# Patient Record
Sex: Male | Born: 1955 | Race: Black or African American | Hispanic: No | Marital: Married | Smoking: Never smoker
Health system: Southern US, Community
[De-identification: ages and names within clinical notes are randomized; demographics above are authoritative.]

## PROBLEM LIST (undated history)

## (undated) ENCOUNTER — Ambulatory Visit (HOSPITAL_COMMUNITY): Disposition: A | Discharge status: Home / Self Care | Payer: Self-pay

## (undated) DIAGNOSIS — H269 Unspecified cataract: Secondary | ICD-10-CM

## (undated) DIAGNOSIS — I10 Essential (primary) hypertension: Secondary | ICD-10-CM

## (undated) HISTORY — DX: Unspecified cataract: H26.9

## (undated) HISTORY — PX: TOE SURGERY: SHX1073

---

## 1999-11-29 ENCOUNTER — Emergency Department (HOSPITAL_COMMUNITY): Admission: EM | Admit: 1999-11-29 | Discharge: 1999-11-29 | Payer: Self-pay | Admitting: *Deleted

## 2000-07-06 ENCOUNTER — Inpatient Hospital Stay (HOSPITAL_COMMUNITY): Admission: EM | Admit: 2000-07-06 | Discharge: 2000-07-08 | Payer: Self-pay | Admitting: Emergency Medicine

## 2000-07-06 ENCOUNTER — Encounter: Payer: Self-pay | Admitting: Emergency Medicine

## 2011-03-08 NOTE — Discharge Summary (Signed)
[place]  Patient:    Adam Martinez, Adam Martinez                      MRN: [id number] Adm. Date:  [id number] Disc. Date: [id number] Attending:  Arsenio Loader Dictator:   Ilene Qua, M.D.                           Discharge Summary  DATE OF BIRTH:  [birth year].  DISCHARGE DIAGNOSES: 1. Syncope and vertigo. 2. Left lower extremity deep venous thrombosis.  CONSULTS:  None.  PROCEDURES: 1. A 2D echocardiogram was performed which was normal and included a bubble    study to rule out any septal defect. 2. A CT of the head was normal. 3. Chest CT for PE protocol revealed no PE. 4. The CT of the lower extremities revealed the left leg with soft tissue    fluid decreased density in the left popliteal vein, not definitive    _____________ for DVT. 5. Lower extremity Dopplers on July 07, 2000, revealed a positive left    short segmental DVT in the mid superficial femoral vein. 6. MRI of the head showed mild paranasal sinus disease but was otherwise    unremarkable.  DISCHARGE MEDICATIONS: 1. Percocet one to two p.o. q.4h. p.r.n. 2. Lovenox 85 mg subcutaneous b.i.d. as directed. 3. Coumadin 5 mg p.o. q.d.  ACTIVITY:  He was instructed to limit his exercise over the next week and to use crutches until he was able to bear weight on his left leg.  SPECIAL INSTRUCTIONS:  He was instructed to return to the emergency room if he should develop a sudden onset of shortness of breath.  He will continue to take his Lovenox injections and the Coumadin until his INR level was greater than 2.0.  At that time the Lovenox injections will be stopped.  FOLLOW-UP: He is scheduled for a blood test on Friday, July 11, 2000, at 9:30 a.m.; then on Friday, July 18, 2000, and the following Friday at the [facility] for PT and INR levels.  He is scheduled for a hospital follow-up appointment on August 07, 2000, at 1:30 p.m. with myself,  Ilene Qua, M.D., at the [facility].  He will need to follow up then on his PT INR as well as HIV test and protein C&S tests which are still pending.  BRIEF HISTORY OF PRESENT ILLNESS:  Adam Martinez is a 55 year old black male with no significant past medical history.  He presented to the emergency room after two episodes of syncope.  One occurred after he stood up and was walking to the bathroom.  He felt cold, sweaty and dizzy and described the room spinning around about one minute before things went black.  He denied any chest pain, headache, but his wife states that he was out for about 10 minutes.  When he woke up, he rolled over onto his back and looked at the ceiling. Again, he reported the feeling of beginning to spin around and he felt weak and dizzy again and again passed out for another five minutes.  He has not had a previous episode of syncope or presyncope and only noted an injury to his left leg last week while playing soccer.  He felt that he pulled a muscle and stated that the pain currently was about an 8/10 and relieved somewhat with Advil.  ADMISSION PHYSICAL EXAMINATION:  GENERAL APPEARANCE:  He was a pleasant black male in no acute distress.  VITAL SIGNS:  Temperature 97.1, blood pressure 122/77, pulse 58, respiratory rate 20.  He was saturating 98% on room air.  LUNGS:  Clear.  CARDIOVASCULAR:  He had regular rate and rhythm without murmur.  EXTREMITIES:  There was no erythema or swelling of either extremity but there was severe tenderness to palpation of the left upper inner thigh.  No ecchymosis or cords could be palpated but his range of motion was limited in that leg as well secondary to pain.  LABS ON ADMISSION:  Normal.  BMP with sodium of 137, potassium 4.3, chloride 109, bicarb 29, BUN 13, creatinine 0.9, glucose 109.  The rest of his CMP was normal. White count was 6.9, hemoglobin 14.1, platelet count 223.  PT was 15.4, INR  1.4, PTT 21.  Blood gas was 7.40, 43 and 82 on room air.  CK was 208, MB 1.0 and troponin I was 0.03.  EKG revealed sinus bradycardia at 55 beats per minute and was otherwise normal.  CT of the head showed no acute disease. CT of the chest showed no PE. The CT of the lower extremities showed a left soft tissue fluid decreased density of the left popliteal vein, worrisome for DVT.  HOSPITAL COURSE:  #1 - SYNCOPE AND VERTIGO:  As these occurred together, this was concerning for some central neurologic process to explain both of these and could not be just attributed to vasogenic syncope given the vertigo.  For this reason, an MRI was performed to evaluate for any cerebellar or brainstem process, which was normal.  Also a 2D echocardiogram was performed to evaluate for possible ASD that could cause embolus from his DVT to travel to his brain which could provide an explanation for both of these symptoms.  A bubble study was performed with the 2D echo which was negative for any paradoxical emboli.  He did not have any further episodes of syncope or vertigo while in the hospital. At the time of discharge there was no explanation for his vertigo but the syncope did appear to be vasogenic.  #2 - LEFT LOWER EXTREMITY DEEP VENOUS THROMBOSIS:  After the CT scan was done per protocol in the ER, lower extremity Dopplers were performed to confirm the diagnosis of a left short segmental DVT in the mid superficial femoral vein. For this reason he was started on Lovenox 80 mg b.i.d. subcutaneous.  He opted to go home on Lovenox as his Coumadin becomes therapeutic.  His PT and INR on the day of discharge were 15.5 and 1.4 for his INR toward a goal of 2 to 3 on the INR.  He was sent home on Coumadin 5 mg to be taken daily and given a close follow-up at the [facility] for further adjustments of his Coumadin dosing.  He was instructed on how to give Lovenox injection and he verbalized  understanding of this as well as explained some of the possible complications of his deep venous thrombosis and the importance of taking his  anticoagulation therapy as prescribed. The tenderness in his left upper extremity did improve somewhat while he was in the hospital but he did require quite an amount of pain medication including morphine which did not help as much as the Percocet.  He was sent home with a prescription for some Percocet temporarily until the pain resolved.  CONDITION ON DISCHARGE:  Stable. DD:  07/23/00 TD:  07/24/00 Job: [id number]  [id number]

## 2011-07-07 ENCOUNTER — Emergency Department (HOSPITAL_COMMUNITY)
Admission: EM | Admit: 2011-07-07 | Discharge: 2011-07-07 | Disposition: A | Discharge status: Home / Self Care | Payer: No Typology Code available for payment source | Attending: Emergency Medicine | Admitting: Emergency Medicine

## 2011-07-07 ENCOUNTER — Emergency Department (HOSPITAL_COMMUNITY): Payer: No Typology Code available for payment source

## 2011-07-07 DIAGNOSIS — S139XXA Sprain of joints and ligaments of unspecified parts of neck, initial encounter: Secondary | ICD-10-CM | POA: Diagnosis not present

## 2011-07-07 DIAGNOSIS — IMO0002 Reserved for concepts with insufficient information to code with codable children: Secondary | ICD-10-CM | POA: Diagnosis not present

## 2011-07-07 DIAGNOSIS — Z043 Encounter for examination and observation following other accident: Secondary | ICD-10-CM | POA: Diagnosis present

## 2011-07-07 DIAGNOSIS — R110 Nausea: Secondary | ICD-10-CM | POA: Diagnosis not present

## 2011-07-07 DIAGNOSIS — I10 Essential (primary) hypertension: Secondary | ICD-10-CM | POA: Diagnosis not present

## 2012-07-09 ENCOUNTER — Ambulatory Visit
Admission: RE | Admit: 2012-07-09 | Discharge: 2012-07-09 | Disposition: A | Discharge status: Home / Self Care | Payer: BC Managed Care – PPO | Source: Ambulatory Visit | Attending: General Practice | Admitting: General Practice

## 2012-07-09 ENCOUNTER — Other Ambulatory Visit: Payer: Self-pay | Admitting: General Practice

## 2012-07-09 DIAGNOSIS — I10 Essential (primary) hypertension: Secondary | ICD-10-CM

## 2016-07-10 ENCOUNTER — Emergency Department (HOSPITAL_COMMUNITY): Payer: Self-pay

## 2016-07-10 ENCOUNTER — Ambulatory Visit (HOSPITAL_COMMUNITY)
Admission: EM | Admit: 2016-07-10 | Discharge: 2016-07-10 | Disposition: A | Discharge status: Nursing Home (Includes ICF) | Payer: Self-pay | Attending: Family Medicine | Admitting: Family Medicine

## 2016-07-10 ENCOUNTER — Encounter (HOSPITAL_COMMUNITY): Payer: Self-pay | Admitting: Emergency Medicine

## 2016-07-10 ENCOUNTER — Emergency Department (HOSPITAL_COMMUNITY)
Admission: EM | Admit: 2016-07-10 | Discharge: 2016-07-10 | Disposition: A | Discharge status: Home / Self Care | Payer: Self-pay | Attending: Emergency Medicine | Admitting: Emergency Medicine

## 2016-07-10 DIAGNOSIS — R4701 Aphasia: Secondary | ICD-10-CM | POA: Insufficient documentation

## 2016-07-10 DIAGNOSIS — R479 Unspecified speech disturbances: Secondary | ICD-10-CM

## 2016-07-10 DIAGNOSIS — I10 Essential (primary) hypertension: Secondary | ICD-10-CM | POA: Insufficient documentation

## 2016-07-10 DIAGNOSIS — R9431 Abnormal electrocardiogram [ECG] [EKG]: Secondary | ICD-10-CM | POA: Insufficient documentation

## 2016-07-10 DIAGNOSIS — R002 Palpitations: Secondary | ICD-10-CM

## 2016-07-10 DIAGNOSIS — R791 Abnormal coagulation profile: Secondary | ICD-10-CM | POA: Insufficient documentation

## 2016-07-10 DIAGNOSIS — I169 Hypertensive crisis, unspecified: Secondary | ICD-10-CM

## 2016-07-10 DIAGNOSIS — R4789 Other speech disturbances: Secondary | ICD-10-CM

## 2016-07-10 HISTORY — DX: Essential (primary) hypertension: I10

## 2016-07-10 LAB — I-STAT CHEM 8, ED
BUN: 15 mg/dL (ref 6–20)
CALCIUM ION: 1.18 mmol/L (ref 1.15–1.40)
CREATININE: 1.2 mg/dL (ref 0.61–1.24)
Chloride: 102 mmol/L (ref 101–111)
GLUCOSE: 93 mg/dL (ref 65–99)
HCT: 52 % (ref 39.0–52.0)
Hemoglobin: 17.7 g/dL — ABNORMAL HIGH (ref 13.0–17.0)
Potassium: 4.1 mmol/L (ref 3.5–5.1)
Sodium: 142 mmol/L (ref 135–145)
TCO2: 29 mmol/L (ref 0–100)

## 2016-07-10 LAB — DIFFERENTIAL
BASOS ABS: 0.1 10*3/uL (ref 0.0–0.1)
Basophils Relative: 1 %
EOS ABS: 0.1 10*3/uL (ref 0.0–0.7)
Eosinophils Relative: 2 %
LYMPHS ABS: 2.3 10*3/uL (ref 0.7–4.0)
Lymphocytes Relative: 43 %
MONOS PCT: 6 %
Monocytes Absolute: 0.3 10*3/uL (ref 0.1–1.0)
Neutro Abs: 2.5 10*3/uL (ref 1.7–7.7)
Neutrophils Relative %: 48 %

## 2016-07-10 LAB — APTT: APTT: 31 s (ref 24–36)

## 2016-07-10 LAB — PROTIME-INR
INR: 1.21
Prothrombin Time: 15.4 seconds — ABNORMAL HIGH (ref 11.4–15.2)

## 2016-07-10 LAB — COMPREHENSIVE METABOLIC PANEL
ALT: 21 U/L (ref 17–63)
AST: 28 U/L (ref 15–41)
Albumin: 4.7 g/dL (ref 3.5–5.0)
Alkaline Phosphatase: 74 U/L (ref 38–126)
Anion gap: 8 (ref 5–15)
BILIRUBIN TOTAL: 0.9 mg/dL (ref 0.3–1.2)
BUN: 12 mg/dL (ref 6–20)
CO2: 28 mmol/L (ref 22–32)
CREATININE: 1.18 mg/dL (ref 0.61–1.24)
Calcium: 10 mg/dL (ref 8.9–10.3)
Chloride: 105 mmol/L (ref 101–111)
GFR calc Af Amer: >60 mL/min (ref >60)
Glucose, Bld: 94 mg/dL (ref 65–99)
POTASSIUM: 4.2 mmol/L (ref 3.5–5.1)
Sodium: 141 mmol/L (ref 135–145)
TOTAL PROTEIN: 7.8 g/dL (ref 6.5–8.1)

## 2016-07-10 LAB — CBC
HEMATOCRIT: 50 % (ref 39.0–52.0)
HEMOGLOBIN: 16.9 g/dL (ref 13.0–17.0)
MCH: 28.3 pg (ref 26.0–34.0)
MCHC: 33.8 g/dL (ref 30.0–36.0)
MCV: 83.6 fL (ref 78.0–100.0)
Platelets: 216 10*3/uL (ref 150–400)
RBC: 5.98 MIL/uL — ABNORMAL HIGH (ref 4.22–5.81)
RDW: 13.1 % (ref 11.5–15.5)
WBC: 5.3 10*3/uL (ref 4.0–10.5)

## 2016-07-10 LAB — I-STAT TROPONIN, ED: TROPONIN I, POC: 0.02 ng/mL (ref 0.00–0.08)

## 2016-07-10 NOTE — ED Notes (Signed)
Pt will be transferred to ED via shuttle for further evaluation.  Report was called to Janett Billow, RN in the ED.

## 2016-07-10 NOTE — ED Triage Notes (Signed)
Pt arrives via POV sent from urgent care for further eval of stroke like symptoms. Pt reports 3 weeks of aphasia, difficulty with word finding, slurred speech, right sided headache and seeing shadows in right eye. Pt states symptoms started after tooth extraction several weeks ago. Pt also reports heart palpitations while lying flat at night EKG done in triage shows changes from previous. Denies pain at present. Awake, alert, oriented x4, no droop or drift noted.

## 2016-07-10 NOTE — ED Triage Notes (Addendum)
Pt reports having several symptoms over the last two weeks since having a tooth extracted.  It started with the pain in his tooth, reporting a throbbing on the right side of his head, above the area of the tooth pain.  After the extraction of the tooth he reports a twitching feeling in the same spot he had the throbbing about 3 times a day, flashes of light in his peripheral, and a sensitivity to light.  He also reports some symptoms of aphasia where he will say a word and realize later it was the wrong word.  Pt admits to history of HBP but does not take medication for it.

## 2016-07-10 NOTE — Discharge Instructions (Signed)
°  All the results in the ER are normal, labs and imaging. We are not sure what is causing your symptoms. The workup in the ER is not complete, and is limited to screening for life threatening and emergent conditions only, so please see a primary care doctor for further evaluation.  Now incidentally we found abnormal ekg - please see the Cardiologist for optima evaluation immediately.  Please return to the ER if you have worsening chest pain, shortness of breath, pain radiating to your jaw, shoulder, or back, sweats or fainting. Otherwise see the Cardiologist or your primary care doctor as requested.

## 2016-07-10 NOTE — ED Notes (Signed)
Patient transported to MRI 

## 2016-07-10 NOTE — ED Provider Notes (Signed)
CSN: [id number]     Arrival date & time 07/10/16  1100 History   First MD Initiated Contact with Patient 07/10/16 1310     Chief Complaint  Patient presents with  . Aphasia  . Photophobia   (Consider location/radiation/quality/duration/timing/severity/associated sxs/prior Treatment) Adam Martinez is a well-appearing 60 y.o, with history of uncontrolled HTN, presents today for photosensitivity and difficulty speaking. His BP is 192/111 in room. He self d/c his BP medication 1 year ago and have since pursue yoga, exercise and other lifestyle changes to manage his blood pressure. He is here complaining of "missing my speech". He reports that when he see something, like a phone, he would call it or say wallet but meant to say phone instead and would immediately correct himself. This speech difficulty have been ongoing daily on and off for the last 3 weeks. He also states "there is something shaking in here ", point to his tempo area of the head, and describes it as a "throbbing" but no pain. He throbbing have been present for 3 weeks as well. He initially thought it was due to his toothache, but he got his wisdom tooth removed 2 weeks ago and the symptoms are still present. He also endorses fatigue, photosensitivity, heart palpitation, and overall feels "blur". He denies headache, nausea, dizziness, ear pain, tinnitus, chest pain, or shortness of breath.       Past Medical History:  Diagnosis Date  . Hypertension    History reviewed. No pertinent surgical history. Family History  Problem Relation Age of Onset  . Heart failure Mother   . Asthma Father    Social History  Substance Use Topics  . Smoking status: Never Smoker  . Smokeless tobacco: Never Used  . Alcohol use Yes     Comment: occasional    Review of Systems  Constitutional: Positive for fatigue. Negative for activity change and fever.  HENT: Negative for congestion, ear pain and tinnitus.   Eyes: Positive for photophobia.  Negative for pain, discharge, redness and visual disturbance.  Respiratory: Negative for cough and shortness of breath.   Cardiovascular: Positive for palpitations. Negative for chest pain and leg swelling.  Gastrointestinal: Negative for abdominal pain, diarrhea, nausea and vomiting.  Musculoskeletal: Negative for arthralgias and myalgias.  Skin: Negative for rash.  Neurological: Negative for dizziness, weakness and headaches.    Allergies  Chloroquine  Home Medications   Prior to Admission medications   Not on File   Meds Ordered and Administered this Visit  Medications - No data to display  BP (!) 192/111 (BP Location: Left Arm)   Pulse 70   Temp 98.1 F (36.7 C) (Oral)   SpO2 100%  No data found.   Physical Exam  Constitutional: He is oriented to person, place, and time. He appears well-developed and well-nourished. No distress.  HENT:  Head: Normocephalic and atraumatic.  Right Ear: External ear normal.  Left Ear: External ear normal.  Nose: Nose normal.  Mouth/Throat: Oropharynx is clear and moist. No oropharyngeal exudate.  Eyes: EOM are normal. Pupils are equal, round, and reactive to light.  Neck: Normal range of motion. Neck supple.  Cardiovascular: Normal rate, regular rhythm and normal heart sounds.   No murmur heard. Pulmonary/Chest: Effort normal and breath sounds normal. No respiratory distress. He has no wheezes.  Abdominal: Soft. Bowel sounds are normal. He exhibits no distension. There is no tenderness.  Lymphadenopathy:    He has no cervical adenopathy.  Neurological: He is alert and oriented to  person, place, and time. No cranial nerve deficit. Coordination normal.  Negative romberg, had intact coordination, muscle strength are equally strong in the upper and lower extremities. Had no arm or leg drift. Had no facial droop, speech is normal in room. Cranial nerves intact.   Skin: Skin is warm and dry. He is not diaphoretic.  Nursing note and vitals  reviewed.   Urgent Care Course   Clinical Course    Procedures (including critical care time)  Labs Review Labs Reviewed - No data to display  Imaging Review     MDM   1. Speech abnormality   2. Heart palpitations   3. Hypertensive crisis    Although the physical examination was unremarkable, the reported complaints are concerning and requires further workup. Patient transferred to ER via shuttle for further evaluation and for diagnostic imaging.     Barry Dienes, NP 07/10/16 1609

## 2016-07-11 ENCOUNTER — Telehealth (HOSPITAL_BASED_OUTPATIENT_CLINIC_OR_DEPARTMENT_OTHER): Payer: Self-pay | Admitting: Emergency Medicine

## 2016-07-11 ENCOUNTER — Encounter: Payer: Self-pay | Admitting: Interventional Cardiology

## 2016-07-11 DIAGNOSIS — R9431 Abnormal electrocardiogram [ECG] [EKG]: Secondary | ICD-10-CM | POA: Insufficient documentation

## 2016-07-11 DIAGNOSIS — I119 Hypertensive heart disease without heart failure: Secondary | ICD-10-CM | POA: Insufficient documentation

## 2016-07-11 NOTE — ED Provider Notes (Signed)
[place] DEPT Provider Note   CSN: [id number] Arrival date & time: 07/10/16  1403     History   Chief Complaint Chief Complaint  Patient presents with  . Aphasia  . Headache  . Palpitations    HPI Adam Martinez is a 60 y.o. male.  HPI Pt comes in with cc of aphasia, sent from UC. Pt reports that for the last several days he has been having difficulty with word finding when he is having conversation. 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 Pt denies any numbness, tingling, focal weakness, slurred speech, vision complains, dizziness. He is also noted to have elevated BP, and there is no associated chest pain, dib. PT is active and walks/runs 2x daily without any symptoms.   Past Medical History:  Diagnosis Date  . Hypertension     There are no active problems to display for this patient.   History reviewed. No pertinent surgical history.     Home Medications    Prior to Admission medications   Not on File    Family History Family History  Problem Relation Age of Onset  . Heart failure Mother   . Asthma Father     Social History Social History  Substance Use Topics  . Smoking status: Never Smoker  . Smokeless tobacco: Never Used  . Alcohol use Yes     Comment: occasional     Allergies   Chloroquine   Review of Systems Review of Systems  ROS 10 Systems reviewed and are negative for acute change except as noted in the HPI.     Physical Exam Updated Vital Signs BP 187/91   Pulse 66   Temp 98.3 F (36.8 C)   Resp 16   SpO2 99%   Physical Exam  Constitutional: He is oriented to person, place, and time. He appears well-developed.  HENT:  Head: Normocephalic and atraumatic.  Eyes: Conjunctivae and EOM are normal. Pupils are equal, round, and reactive to light.  Neck: Normal range of motion. Neck supple.  Cardiovascular: Normal rate and  regular rhythm.   Murmur heard. Pulmonary/Chest: Effort normal and breath sounds normal.  Abdominal: Soft. Bowel sounds are normal. He exhibits no distension. There is no tenderness. There is no rebound and no guarding.  Neurological: He is alert and oriented to person, place, and time. No cranial nerve deficit. Coordination normal.  Cerebellar exam is normal (finger to nose) Sensory exam normal for bilateral upper and lower extremities - and patient is able to discriminate between sharp and dull. Motor exam is 4+/5   Skin: Skin is warm.  Nursing note and vitals reviewed.    ED Treatments / Results  Labs (all labs ordered are listed, but only abnormal results are displayed) Labs Reviewed  PROTIME-INR - Abnormal; Notable for the following:       Result Value   Prothrombin Time 15.4 (*)    All other components within normal limits  CBC - Abnormal; Notable for the following:    RBC 5.98 (*)    All other components within normal limits  I-STAT CHEM 8, ED - Abnormal; Notable for the following:    Hemoglobin 17.7 (*)    All other components within normal limits  APTT  DIFFERENTIAL  COMPREHENSIVE METABOLIC PANEL  Randolm Idol, ED    EKG  EKG Interpretation  Date/Time:  Wednesday July 10 2016 14:05:51  EDT Ventricular Rate:  62 PR Interval:  178 QRS Duration: 88 QT Interval:  436 QTC Calculation: 442 R Axis:   56 Text Interpretation:  Normal sinus rhythm with sinus arrhythmia Possible Left atrial enlargement ST & T wave abnormality, consider inferolateral ischemia Abnormal ECG Nonspecific ST abnormality diffuse ST depression - new Diffuse TWI - new New findings since ther last ekg in 2001 Confirmed by Kathrynn Humble, MD, Thelma Comp [phone]) on 07/10/2016 6:31:55 PM       Radiology Ct Head Wo Contrast  Result Date: 07/10/2016 CLINICAL DATA:  60 year old male Status post tooth excision a month ago with episode of right side headache and aphasia. Initial encounter. EXAM:  CT HEAD WITHOUT CONTRAST TECHNIQUE: Contiguous axial images were obtained from the base of the skull through the vertex without intravenous contrast. COMPARISON:  Head CT and cervical spine CT 06/27/2011. FINDINGS: Brain: Cerebral volume remains normal. No midline shift, ventriculomegaly, mass effect, evidence of mass lesion, intracranial hemorrhage or evidence of cortically based acute infarction. Gray-white matter differentiation is within normal limits throughout the brain. Vascular: No suspicious intracranial vascular hyperdensity. Calcified atherosclerosis at the skull base. Skull: Dentition not included. No acute osseous abnormality identified. Sinuses/Orbits: Mild ethmoid and anterior sphenoid sinus mucosal thickening appears stable since 2012. Right maxillary sinus mucosal thickening has regressed. Mastoids and tympanic cavities are clear. Other: No acute orbit or scalp soft tissue finding. IMPRESSION: 1. Stable and normal noncontrast CT appearance of the brain. 2. Chronic ethmoid and sphenoid paranasal sinus mucosal thickening appears stable. Visible maxillary sinus mucosal thickening appears regressed. Electronically Signed   By: Genevie Ann M.D.   On: 07/10/2016 15:59   Mr Brain Wo Contrast  Result Date: 07/10/2016 CLINICAL DATA:  Stroke-like symptoms. Aphasia and right-sided headache. Right eye visual abnormalities. EXAM: MRI HEAD WITHOUT CONTRAST TECHNIQUE: Multiplanar, multiecho pulse sequences of the brain and surrounding structures were obtained without intravenous contrast. COMPARISON:  Head CT same day FINDINGS: Brain: No acute infarct or intraparenchymal hemorrhage. The midline structures are normal. There are a few small foci of hyperintense T2 weighted signal within the periventricular and subcortical white matter in a nonspecific distribution but most commonly seen in the setting of chronic microvascular ischemia. No mass lesion or midline shift. No hydrocephalus or extra-axial fluid  collection. Vascular: Major intracranial flow voids are preserved. No evidence of chronic microhemorrhage or amyloid angiopathy. Skull and upper cervical spine: The visualized skull base, calvarium, upper cervical spine and extracranial soft tissues are normal. Sinuses/Orbits: There is mild mucosal thickening of the ethmoid air cells, right frontal sinus and right maxillary sinus. There is a retention cyst along the left maxillary floor. No mastoid effusion. Normal orbits. IMPRESSION: 1. No acute intracranial abnormality. 2. Mild multifocal white matter T2 hyperintensity, nonspecific but most suggestive of chronic microvascular ischemia. Electronically Signed   By: Ulyses Jarred M.D.   On: 07/10/2016 19:04    Procedures Procedures (including critical care time)  Medications Ordered in ED Medications - No data to display   Initial Impression / Assessment and Plan / ED Course  I have reviewed the triage vital signs and the nursing notes.  Pertinent labs & imaging results that were available during my care of the patient were reviewed by me and considered in my medical decision making (see chart for details).  Clinical Course    Pt comes in with cc of word finding difficulty. Sent here from [facility] for MRI. On my exam - pt has no aphasia. He does indicate that  more and more he is having difficulty thinking of a specific word when he is speaking. Seems more of a memory issues - possibly dementia?  PT however is noted with elevated BP. He has some headaches - but they are mild. Denies chest pain, dib, vision complains. We will get MRI of the brain to ensure there is no acute infarcts - if neg, will defer workup to pcp. EKG done as part of the stroke panel by nursing does show LVH and diffuse TWI. It will be important for him to f/u with Cards, and that plan has been discussed.  Strict ER return precautions have been discussed, and patient is agreeing with the plan and is comfortable with the workup  done and the recommendations from the ER.  Final Clinical Impressions(s) / ED Diagnoses   Final diagnoses:  Abnormal EKG  Word finding difficulty    New Prescriptions There are no discharge medications for this patient.    Varney Biles, MD 07/18/16 [phone]

## 2016-07-12 ENCOUNTER — Encounter: Payer: Self-pay | Admitting: Interventional Cardiology

## 2016-07-12 ENCOUNTER — Ambulatory Visit (INDEPENDENT_AMBULATORY_CARE_PROVIDER_SITE_OTHER): Payer: Self-pay | Admitting: Interventional Cardiology

## 2016-07-12 VITALS — BP 180/92 | HR 80 | Ht 69.0 in | Wt 193.0 lb

## 2016-07-12 DIAGNOSIS — R9431 Abnormal electrocardiogram [ECG] [EKG]: Secondary | ICD-10-CM

## 2016-07-12 DIAGNOSIS — I119 Hypertensive heart disease without heart failure: Secondary | ICD-10-CM

## 2016-07-12 DIAGNOSIS — I517 Cardiomegaly: Secondary | ICD-10-CM

## 2016-07-12 MED ORDER — AMLODIPINE BESYLATE 2.5 MG PO TABS: 2.5000 mg | ORAL_TABLET | Freq: Every day | ORAL | 3 refills | Status: DC

## 2016-07-12 NOTE — Patient Instructions (Signed)
Medication Instructions:  Your physician has recommended you make the following change in your medication:  START Amlodipine 2.5mg  daily an Rx has been sent to your pharmacy    Labwork: None ordered  Testing/Procedures: Your physician has requested that you have an echocardiogram. Echocardiography is a painless test that uses sound waves to create images of your heart. It provides your doctor with information about the size and shape of your heart and how well your heart's chambers and valves are working. This procedure takes approximately one hour. There are no restrictions for this procedure.  Your physician has requested that you have an exercise tolerance test. For further information please visit [email or web address]. Please also follow instruction sheet, as given. ( To be scheduled after 7 days)  Follow-Up: Your physician recommends that you schedule a follow-up appointment after testing with Dr.Smith   Any Other Special Instructions Will Be Listed Below (If Applicable).     If you need a refill on your cardiac medications before your next appointment, please call your pharmacy.

## 2016-07-12 NOTE — Progress Notes (Signed)
Cardiology Office Note    Date:  07/12/2016   ID:  Adam Martinez, DOB [birth year], MRN [id number]  PCP:  No PCP Per Patient  Cardiologist: Sinclair Grooms, MD   Chief Complaint  Patient presents with  . Advice Only    Abnormal EKG and elevated blood pressure    History of Present Illness:  Adam Martinez is a 60 y.o. male who is referred from the emergency department for follow-up of an abnormal electrocardiogram obtained during an evaluation of headache. During that hospital visit he was noted to have significant blood pressure elevation. The EKG revealed evidence of left ventricular hypertrophy with strain.  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 He has a history of elevated blood pressure and states that each attempt to treat it has caused him to get sick and have near fainting episodes. He has had no recent therapy. Blood pressures in the emergency room were greater than 170/100 mmHg. Previous medications tried were Benicar, HCTZ, and some other medication that he can't remember.  He is asymptomatic today. He plays soccer on a regular basis. There is some dyspnea with running until he gets warmed up. After running for a while his breathing settles out. He denies chest pain.  [place] is [place] His mother died of a heart film and that was poorly characterized. He feels that she had some difficulty with heart rhythm. He is uncertain if hypertension is a family problem.  He has known of high blood pressure for greater than 10 years. He has never been on consistent therapy.  Past Medical History:  Diagnosis Date  . Hypertension     No past surgical history on file.  Current Medications: No outpatient prescriptions prior to visit.   No facility-administered medications prior to visit.      Allergies:   Chloroquine   Social History   Social History  .  Marital status: Married    Spouse name: N/A  . Number of children: N/A  . Years of education: N/A   Social History Main Topics  . Smoking status: Never Smoker  . Smokeless tobacco: Never Used  . Alcohol use Yes     Comment: occasional  . Drug use: No  . Sexual activity: Not Asked   Other Topics Concern  . None   Social History Narrative  . None     Family History:  The patient's family history includes Asthma in his father; Heart failure in his mother.   ROS:   Please see the history of present illness.    Occasional vision disturbance, occasional palpitations, chronic low back pain.  All other systems reviewed and are negative.   PHYSICAL EXAM:   VS:  BP (!) 180/92   Pulse 80   Ht 5\' 9"  (1.753 m)   Wt 193 lb (87.5 kg)   BMI 28.50 kg/m    GEN: Well nourished, well developed, in no acute distress  HEENT: normal  Neck: no JVD, carotid bruits, or masses Cardiac: RRR; no murmurs, rubs, or edema . He has an audible apical S4 gallop. Respiratory:  clear to auscultation bilaterally, normal work of breathing GI: soft, nontender, nondistended, + BS MS: no deformity or atrophy  Skin: warm and dry, no rash Neuro:  Alert and Oriented x 3, Strength and sensation are intact Psych: euthymic mood, full affect  Wt Readings from  Last 3 Encounters:  07/12/16 193 lb (87.5 kg)      Studies/Labs Reviewed:   EKG:  EKG  Sinus rhythm, biatrial abnormality, left ventricular hypertrophy with strain.  Recent Labs: 07/10/2016: ALT 21; BUN 15; Creatinine, Ser 1.20; Hemoglobin 17.7; Platelets 216; Potassium 4.1; Sodium 142   Lipid Panel No results found for: CHOL, TRIG, HDL, CHOLHDL, VLDL, LDLCALC, LDLDIRECT  Additional studies/ records that were reviewed today include:  Stage II chronic kidney disease noted on labs as listed above.    ASSESSMENT:    1. Left ventricular hypertrophy   2. Hypertensive heart disease without heart failure   3. Abnormal ECG      PLAN:  In order  of problems listed above:  1. This is based upon the EKG. Differential includes hereditary hypertrophic cardiomyopathy (given the history of his mother's heart problem) versus hypertension induced LVH. An echocardiogram will be done to confirm. 2. Repeat blood pressure in both arms reveals 152/100 mmHg. Difficulty with therapy in the past his been dizziness and near-syncope. 2-D Doppler echocardiogram will be helpful. Amlodipine 2.5 mg daily is started. An exercise treadmill test will be done on this therapy to evaluate blood pressure control 3. LVH with strain as noted above.    Medication Adjustments/Labs and Tests Ordered: Current medicines are reviewed at length with the patient today.  Concerns regarding medicines are outlined above.  Medication changes, Labs and Tests ordered today are listed in the Patient Instructions below. Patient Instructions  Medication Instructions:  Your physician has recommended you make the following change in your medication:  START Amlodipine 2.5mg  daily an Rx has been sent to your pharmacy    Labwork: None ordered  Testing/Procedures: Your physician has requested that you have an echocardiogram. Echocardiography is a painless test that uses sound waves to create images of your heart. It provides your doctor with information about the size and shape of your heart and how well your heart's chambers and valves are working. This procedure takes approximately one hour. There are no restrictions for this procedure.  Your physician has requested that you have an exercise tolerance test. For further information please visit [email or web address]. Please also follow instruction sheet, as given. ( To be scheduled after 7 days)  Follow-Up: Your physician recommends that you schedule a follow-up appointment after testing with Dr.Smith   Any Other Special Instructions Will Be Listed Below (If Applicable).     If you need a refill on your cardiac medications  before your next appointment, please call your pharmacy.      Signed, Sinclair Grooms, MD  07/12/2016 9:49 AM    [facility] [place], [place], [place]  [address] Phone: [phone]; Fax: [phone]

## 2016-07-19 ENCOUNTER — Ambulatory Visit (INDEPENDENT_AMBULATORY_CARE_PROVIDER_SITE_OTHER): Payer: Self-pay

## 2016-07-19 DIAGNOSIS — R9431 Abnormal electrocardiogram [ECG] [EKG]: Secondary | ICD-10-CM

## 2016-07-19 DIAGNOSIS — I119 Hypertensive heart disease without heart failure: Secondary | ICD-10-CM

## 2016-07-19 LAB — EXERCISE TOLERANCE TEST
CHL CUP RESTING HR STRESS: 83 {beats}/min
CHL RATE OF PERCEIVED EXERTION: 12
CSEPED: 5 min
CSEPEDS: 0 s
CSEPEW: 7 METS
CSEPPHR: 131 {beats}/min
MPHR: 162 {beats}/min
Percent HR: 80 %

## 2016-07-22 ENCOUNTER — Telehealth: Payer: Self-pay | Admitting: *Deleted

## 2016-07-22 MED ORDER — AMLODIPINE BESYLATE 5 MG PO TABS: 5.0000 mg | ORAL_TABLET | Freq: Every day | ORAL | 3 refills | Status: DC

## 2016-07-22 NOTE — Telephone Encounter (Signed)
Spoke with pt and informed him of ETT results and Dr. Thompson Caul recommendations.  Pt verbalized understanding and was in agreement with this plan.

## 2016-07-22 NOTE — Telephone Encounter (Signed)
-----   Message from Belva Crome, MD sent at 07/19/2016  5:33 PM EDT ----- Let the patient know stress test confirms that hypertension is a major problem. The current therapy will need to be further up titrated to get good control. Increase amlodipine to 5 mg daily. Further adjustments will be made on return appointment. A copy will be sent to No PCP Per Patient

## 2016-07-23 ENCOUNTER — Other Ambulatory Visit: Payer: Self-pay

## 2016-07-23 ENCOUNTER — Ambulatory Visit (HOSPITAL_COMMUNITY): Payer: BC Managed Care – PPO | Attending: Cardiovascular Disease

## 2016-07-23 DIAGNOSIS — I501 Left ventricular failure: Secondary | ICD-10-CM | POA: Insufficient documentation

## 2016-07-23 DIAGNOSIS — I119 Hypertensive heart disease without heart failure: Secondary | ICD-10-CM | POA: Diagnosis not present

## 2016-07-23 DIAGNOSIS — I517 Cardiomegaly: Secondary | ICD-10-CM | POA: Insufficient documentation

## 2016-08-06 ENCOUNTER — Ambulatory Visit: Payer: BC Managed Care – PPO | Admitting: Interventional Cardiology

## 2016-08-07 ENCOUNTER — Encounter: Payer: Self-pay | Admitting: Interventional Cardiology

## 2020-05-02 ENCOUNTER — Encounter (HOSPITAL_COMMUNITY): Payer: Self-pay

## 2020-05-02 ENCOUNTER — Ambulatory Visit (HOSPITAL_COMMUNITY)
Admission: EM | Admit: 2020-05-02 | Discharge: 2020-05-02 | Disposition: A | Discharge status: Home / Self Care | Payer: BC Managed Care – PPO | Attending: Internal Medicine | Admitting: Internal Medicine

## 2020-05-02 ENCOUNTER — Other Ambulatory Visit: Payer: Self-pay

## 2020-05-02 DIAGNOSIS — S46911A Strain of unspecified muscle, fascia and tendon at shoulder and upper arm level, right arm, initial encounter: Secondary | ICD-10-CM

## 2020-05-02 DIAGNOSIS — I10 Essential (primary) hypertension: Secondary | ICD-10-CM | POA: Diagnosis not present

## 2020-05-02 MED ORDER — MELOXICAM 15 MG PO TABS: 15.0000 mg | ORAL_TABLET | Freq: Every day | ORAL | 1 refills | Status: AC | PRN

## 2020-05-02 MED ORDER — KETOROLAC TROMETHAMINE 60 MG/2ML IM SOLN
60.0000 mg | Freq: Once | INTRAMUSCULAR | Status: AC
  Administered 2020-05-02: 60 mg via INTRAMUSCULAR

## 2020-05-02 MED ORDER — AMLODIPINE BESYLATE 5 MG PO TABS: 5.0000 mg | ORAL_TABLET | Freq: Every day | ORAL | 1 refills | Status: DC

## 2020-05-02 MED ORDER — KETOROLAC TROMETHAMINE 60 MG/2ML IM SOLN
INTRAMUSCULAR | Status: AC
  Filled 2020-05-02: qty 2

## 2020-05-02 NOTE — ED Triage Notes (Signed)
Patient presents here today with complaints of right shoulder pain that began about five months but has gotten worse in the last 1-2 weeks. Patient states he injured his right shoulder playing in a soccer game  - got tripped - and fell on his right side. Patient states he has not gone anywhere to have his right shoulder looked at before now.

## 2020-05-02 NOTE — ED Provider Notes (Signed)
[place]    CSN: [id number] Arrival date & time: 05/02/20  1108      History   Chief Complaint Chief Complaint  Patient presents with  . Shoulder Pain    HPI Adam Martinez is a 64 y.o. male with past medical history of hypertension presents to urgent care today with right shoulder pain.  Patient states symptoms ongoing for several months after injury playing soccer however feels like pain has become worse in the past couple of weeks.  Patient states initial injury occurred when being hit on soccer field and falling onto affected shoulder.  Patient states pain will occasionally keep him up at night and shoulder sometimes feels "locked".  In regards to history of hypertension patient states he has been on blood pressure medication in the past however stopped as he had "hoped he did not need it anymore".  He denies any recent headache, dizziness, chest pain, shortness of breath.    Past Medical History:  Diagnosis Date  . Hypertension     Patient Active Problem List   Diagnosis Date Noted  . Hypertensive heart disease 07/11/2016  . Abnormal ECG 07/11/2016    History reviewed. No pertinent surgical history.     Home Medications    Prior to Admission medications   Medication Sig Start Date End Date Taking? Authorizing Provider  amLODipine (NORVASC) 5 MG tablet Take 1 tablet (5 mg total) by mouth daily. 05/02/20 07/31/20  Rudolpho Sevin, NP  meloxicam (MOBIC) 15 MG tablet Take 1 tablet (15 mg total) by mouth daily as needed for pain. 05/02/20 06/01/20  Rudolpho Sevin, NP    Family History Family History  Problem Relation Age of Onset  . Heart failure Mother   . Asthma Father     Social History Social History   Tobacco Use  . Smoking status: Current Every Day Smoker    Types: Cigarettes  . Smokeless tobacco: Never Used  Vaping Use  . Vaping Use: Never used  Substance Use Topics  . Alcohol use: Yes    Comment: occasional  . Drug use: No      Allergies   Chloroquine   Review of Systems As stated in HPI otherwise negative   Physical Exam Triage Vital Signs ED Triage Vitals  Enc Vitals Group     BP 05/02/20 1325 (!) 195/101     Pulse Rate 05/02/20 1325 79     Resp 05/02/20 1325 16     Temp 05/02/20 1325 98 F (36.7 C)     Temp Source 05/02/20 1325 Oral     SpO2 05/02/20 1325 100 %     Weight --      Height --      Head Circumference --      Peak Flow --      Pain Score 05/02/20 1330 8     Pain Loc --      Pain Edu? --      Excl. in [place]? --    No data found.  Updated Vital Signs BP (!) 189/94 (BP Location: Right Arm)   Pulse 70   Temp 98 F (36.7 C) (Oral)   Resp 16   SpO2 100%      Physical Exam Constitutional:      Appearance: Normal appearance. He is normal weight.  Musculoskeletal:        General: No swelling or deformity.     Cervical back: Normal range of motion and neck supple.  Comments: Pain upon palpation of right posterior shoulder joint space.  No pain with 90 degree flexion of arm.  Pain when asking to touch raised hand to back  Skin:    General: Skin is warm and dry.  Neurological:     General: No focal deficit present.     Mental Status: He is alert and oriented to person, place, and time.  Psychiatric:        Mood and Affect: Mood normal.        Behavior: Behavior normal.     UC Treatments / Results  Labs (all labs ordered are listed, but only abnormal results are displayed) Labs Reviewed - No data to display  EKG   Radiology No results found.  Procedures Procedures (including critical care time)  Medications Ordered in UC Medications  ketorolac (TORADOL) injection 60 mg (60 mg Intramuscular Given 05/02/20 1354)    Initial Impression / Assessment and Plan / UC Course  I have reviewed the triage vital signs and the nursing notes.  Pertinent labs & imaging results that were available during my care of the patient were reviewed by me and considered in my  medical decision making (see chart for details).  Shoulder pain, right -unclear etiology. ? Rotator cuff injury for labral tear  -Toradol IM in office, can take short NSAID course  -refer to [facility] to determine need for further imaging  Hypertension, uncontrolled -pt took himself off Amlodipine hoping he "didn't need it anymore" -long discussion regarding long term effects of uncontrolled htn -restart amlodipine -pt to est primary care  Final Clinical Impressions(s) / UC Diagnoses   Final diagnoses:  Strain of right shoulder, initial encounter  Hypertension, unspecified type     Discharge Instructions     Take the antiinflammatory medication as needed starting tomorrow. Apply ice to your shoulder to see if this helps with pain. I have referred you to [facility] to further evaluate your shoulder pain. You also need to restart your blood pressure medication. Please establish primary care.     ED Prescriptions    Medication Sig Dispense Auth. Provider   amLODipine (NORVASC) 5 MG tablet Take 1 tablet (5 mg total) by mouth daily. 90 tablet Rudolpho Sevin, NP   meloxicam (MOBIC) 15 MG tablet Take 1 tablet (15 mg total) by mouth daily as needed for pain. 30 tablet Rudolpho Sevin, NP     PDMP not reviewed this encounter.   Rudolpho Sevin, NP 05/02/20 2114

## 2020-05-02 NOTE — Discharge Instructions (Addendum)
Take the antiinflammatory medication as needed starting tomorrow. Apply ice to your shoulder to see if this helps with pain. I have referred you to [facility] to further evaluate your shoulder pain. You also need to restart your blood pressure medication. Please establish primary care.

## 2020-05-10 ENCOUNTER — Ambulatory Visit: Payer: Self-pay

## 2020-05-10 ENCOUNTER — Ambulatory Visit: Payer: BC Managed Care – PPO | Admitting: Family Medicine

## 2020-05-10 ENCOUNTER — Encounter: Payer: Self-pay | Admitting: Family Medicine

## 2020-05-10 ENCOUNTER — Other Ambulatory Visit: Payer: Self-pay

## 2020-05-10 VITALS — BP 156/90 | Ht 69.0 in | Wt 190.0 lb

## 2020-05-10 DIAGNOSIS — M7541 Impingement syndrome of right shoulder: Secondary | ICD-10-CM | POA: Diagnosis not present

## 2020-05-10 DIAGNOSIS — M25511 Pain in right shoulder: Secondary | ICD-10-CM

## 2020-05-10 MED ORDER — METHYLPREDNISOLONE ACETATE 40 MG/ML IJ SUSP
40.0000 mg | Freq: Once | INTRAMUSCULAR | Status: AC
  Administered 2020-05-10: 40 mg via INTRA_ARTICULAR

## 2020-05-10 NOTE — Patient Instructions (Signed)

## 2020-05-10 NOTE — Assessment & Plan Note (Signed)
Patient symptoms exam and ultrasound findings are consistent with right shoulder impingement syndrome.  He had noticeable impingement of the subscap tendon on the dynamic view under ultrasound guidance with subacromial bursitis.  Given patient has already tried prescription strength NSAID and various conservative measures we proceeded with a corticosteroid shot of the subacromial space into the bursa today for more immediate relief.  Patient was given option to do formal physical therapy versus home exercises and since he is healthy and active elected to try at home exercises first and if these are unsuccessful he is willing to do formal PT. -Continue conservative measurements such as ice, heat, Tylenol as needed -Corticosteroid injection of the subacromial space today -Given rotator cuff rehab exercises to perform at home -Follow up in 6 weeks

## 2020-05-10 NOTE — Progress Notes (Signed)
SUBJECTIVE:   CHIEF COMPLAINT / HPI:   Right Shoulder Pain Mr. Oletta Darter is a 64 year old male who is a new patient to this clinic.  His presenting for right shoulder pain which initially began 8 months ago while playing soccer during which time he was pulled down by the right arm by an opposing player and fell.  He states at that time he did have significant shoulder pain however he was able to minimize his symptoms with rest, ice, heat, and over-the-counter NSAIDs.  He states that for a while his symptoms began to get much better and he continued playing soccer.  However in the past few weeks he feels like as he has continued to play soccer his symptoms have returned he has tried several prescription medications most notably meloxicam and has not been able to get any relief for his symptoms this time even with rest ice and heat.  He has tried multiple things and states that nothing has helped so far.  He is able to sleep on his right side but no longer does so because the pain would keep him up at night.  He states that he moves his arm into a certain position or tries to raise it above shoulder level it pops and hurts the pain is described as sharp started in the front of the shoulder and extends into the back and does not radiate down the arm or to the neck.  He denies feeling weak in his right shoulder but does limit his range of motion out of concern for causing pain.  He also states that he at times feels a pop in his shoulder.  PERTINENT  PMH / PSH: HTN  OBJECTIVE:   BP (!) 156/90    Ht 5\' 9"  (1.753 m)    Wt 190 lb (86.2 kg)    BMI 28.06 kg/m    MSK Shoulder, Right: No evidence of bony deformity, asymmetry, or muscle atrophy; No tenderness over long head of biceps (bicipital groove). No TTP at [facility] joint. Limited active and passive range of motion due to pain; Strength 5/5 throughout. No abnormal scapular function observed. Sensation intact. Peripheral pulses intact. Special Tests:   -  Crossarm test: NEG - Jobe test: Positive   - Hawkins: Positive - Apprehension test: NEG   - Sulcus sign: NEG   - Obrien's test: NEG   - Speeds test: NEG   - Crank test: NEG    IMAGING  MSK [place] of Shoulder, Right Patient was seated on exam table and shoulder US examination was performed using high frequency linear probe. Biceps tendon visualized in the bicipital groove in both longitudinal and transverse axis with no signs of fluid or hypoechoic changes. Tendon fibers intact without signs of irregularity. Subscapularis tendon visualized in both longitudinal and transverse axis intact inserting at the inferior lesser tubercule of the humerus. He did have noticeable hypoechoic changes above the subscapularis tendon indicating subacromial bursitis. No signs of tear. He also had positive impingement sign in the dynamic view of the subscapularis tendon on the coracoid process. Supraspinatus tendon visualized in both longitudinal, transverse, and dynamic views. No signs of tear with tendon inserting at the superior facet of the greater tubercle of the humerus, no hypoechoic changes or tissue irregularity seen. Infraspinatus and teres minor tendons visualized in both longitudinal and transverse axis inserting at the middle facet of the great tubercule of the humerus with no signs of tearing, no hypoechoic changes or tissue irregularity seen. [facility]  Joint was visualized with no cap sign.  No posterior labral tear.   ASSESSMENT/PLAN:   Impingement syndrome of right shoulder Patient symptoms exam and ultrasound findings are consistent with right shoulder impingement syndrome.  He had noticeable impingement of the subscap tendon on the dynamic view under ultrasound guidance with subacromial bursitis.  Given patient has already tried prescription strength NSAID and various conservative measures we proceeded with a corticosteroid shot of the subacromial space into the bursa today for more immediate relief.   Patient was given option to do formal physical therapy versus home exercises and since he is healthy and active elected to try at home exercises first and if these are unsuccessful he is willing to do formal PT. -Continue conservative measurements such as ice, heat, Tylenol as needed -Corticosteroid injection of the subacromial space today -Given rotator cuff rehab exercises to perform at home -Follow up in 6 weeks   Procedure Patient was in seated position and informed consent obtained, timeout was performed. Area was cleaned with alcohol pad. A steroid injection was performed at right shoulder subacromial space into the bursa under ultrasound guidance using 1% plain Lidocaine 57ml and 1mg  of Depo-Medrol. This was well tolerated without any immediate complications.  Nuala Alpha, [place]

## 2020-06-21 ENCOUNTER — Ambulatory Visit: Payer: Self-pay

## 2020-06-21 ENCOUNTER — Ambulatory Visit
Admission: RE | Admit: 2020-06-21 | Discharge: 2020-06-21 | Disposition: A | Discharge status: Home / Self Care | Payer: BC Managed Care – PPO | Source: Ambulatory Visit | Attending: Family Medicine | Admitting: Family Medicine

## 2020-06-21 ENCOUNTER — Other Ambulatory Visit: Payer: Self-pay

## 2020-06-21 ENCOUNTER — Ambulatory Visit: Payer: BC Managed Care – PPO | Admitting: Family Medicine

## 2020-06-21 ENCOUNTER — Encounter: Payer: Self-pay | Admitting: Family Medicine

## 2020-06-21 VITALS — BP 140/102 | Ht 69.0 in | Wt 198.0 lb

## 2020-06-21 DIAGNOSIS — M7541 Impingement syndrome of right shoulder: Secondary | ICD-10-CM

## 2020-06-21 NOTE — Assessment & Plan Note (Signed)
Overall, his shoulder pain is much improved and he got good relief from the injection and is tolerating the exercises and feels like it is going well.  However he is having a different type of pain today which is more consistent with glenohumeral osteoarthritis and possible labral involvement. -We will obtain 3 view shoulder x-ray of the right shoulder today and call him with results.  Depending on those results we will offer different treatment options; if he does have glenohumeral osteoarthritis we will offer him an intra-articular shoulder injection and formal physical therapy.

## 2020-06-21 NOTE — Patient Instructions (Addendum)
It was great to see you today! Thank you for letting me participate in your care!  Today, we discussed your right shoulder pain and today it is improved from your last visit.  However, it does appear you may have some arthritis in your shoulder contributing to your continued pain and symptoms. We will have you get x-rays and once we have the results we will call you and discuss the different treatment options.  Be well, Adam Rutherford, DO PGY-4, Sports Medicine Fellow [place]

## 2020-06-21 NOTE — Progress Notes (Signed)
° ° °  SUBJECTIVE:   CHIEF COMPLAINT / HPI:   Right Shoulder Pain F/u [name] returns to clinic today for follow-up due to his right shoulder pain which was thought to be due to impingement syndrome.  He has been diligent about doing the home exercises and states that when he received a shot he got good relief.  He feels like his shoulder is much better however he still does endorse some pain that is mainly located on the "inside" and it feels like it radiates to the back of the shoulder.  It is not constant and intermittent and seems random to him.  He does feel like perhaps activity can make this worse.  When he moves his shoulder around in a circular motion with the emphasis on abduction and flexion it tends to bring about a grinding sensation with a popping noise and this can elicit his pain.  PERTINENT  PMH / PSH: History of hypertensive heart disease  OBJECTIVE:   BP (!) 140/102    Ht 5\' 9"  (1.753 m)    Wt 198 lb (89.8 kg)    BMI 29.24 kg/m   Shoulder, Right: No evidence of bony deformity, asymmetry, or muscle atrophy; No tenderness over long head of biceps (bicipital groove). No TTP at [facility] joint. Full active and passive range of motion Strength 5/5 throughout. No abnormal scapular function observed. Sensation intact. Peripheral pulses intact.  Special Tests:   - Crossarm test: NEG - Jobe test: Positive   - Hawkins: NEG   - Neer test: NEG   - Gerber lift-off test: NEG   - Belly press test: NEG   - Speeds test: NEG   - OBriens: NEG   ASSESSMENT/PLAN:   Impingement syndrome of right shoulder Overall, his shoulder pain is much improved and he got good relief from the injection and is tolerating the exercises and feels like it is going well.  However he is having a different type of pain today which is more consistent with glenohumeral osteoarthritis and possible labral involvement. -We will obtain 3 view shoulder x-ray of the right shoulder today and call him with results.  Depending  on those results we will offer different treatment options; if he does have glenohumeral osteoarthritis we will offer him an intra-articular shoulder injection and formal physical therapy.     Nuala Alpha, DO PGY-4, Sports Medicine Fellow [place]

## 2020-07-03 ENCOUNTER — Other Ambulatory Visit: Payer: Self-pay

## 2020-07-03 ENCOUNTER — Ambulatory Visit (INDEPENDENT_AMBULATORY_CARE_PROVIDER_SITE_OTHER): Payer: BC Managed Care – PPO | Admitting: Family Medicine

## 2020-07-03 ENCOUNTER — Encounter: Payer: Self-pay | Admitting: Family Medicine

## 2020-07-03 VITALS — BP 152/100 | Ht 68.0 in | Wt 198.0 lb

## 2020-07-03 DIAGNOSIS — M25511 Pain in right shoulder: Secondary | ICD-10-CM

## 2020-07-03 MED ORDER — METHYLPREDNISOLONE ACETATE 40 MG/ML IJ SUSP
40.0000 mg | Freq: Once | INTRAMUSCULAR | Status: AC
  Administered 2020-07-03: 40 mg via INTRA_ARTICULAR

## 2020-07-03 NOTE — Progress Notes (Signed)
Patient returned today for intraarticular injection right shoulder for arthritis.  After informed written consent timeout was performed, patient was lying on left side on exam table. Right shoulder was prepped with alcohol swab and utilizing posterior approach with ultrasound guidance, patient's right glenohumeral space was injected with 3:1 bupivicaine: depomedrol. Patient tolerated the procedure well without immediate complications.

## 2020-07-27 NOTE — Progress Notes (Deleted)
Cardiology Office Note:    Date:  07/27/2020   ID:  Adam Martinez, DOB 1955/12/05, MRN [id number]  PCP:  Patient, No Pcp Per  Cardiologist:  No primary care provider on file.   Referring MD: No ref. provider found   No chief complaint on file.   History of Present Illness:    Adam Martinez is a 64 y.o. male with a hx of referral to cardiology from the emergency department after noting abnormal EKG related to LVH with strain, demonstrating significant left ventricular hypertrophy, and significant left ventricular hypertrophy.  ***  Past Medical History:  Diagnosis Date  . Hypertension     No past surgical history on file.  Current Medications: No outpatient medications have been marked as taking for the 07/31/20 encounter (Appointment) with Belva Crome, MD.     Allergies:   Chloroquine   Social History   Socioeconomic History  . Marital status: Married    Spouse name: Not on file  . Number of children: Not on file  . Years of education: Not on file  . Highest education level: Not on file  Occupational History  . Not on file  Tobacco Use  . Smoking status: Current Every Day Smoker    Types: Cigarettes  . Smokeless tobacco: Never Used  Vaping Use  . Vaping Use: Never used  Substance and Sexual Activity  . Alcohol use: Yes    Comment: occasional  . Drug use: No  . Sexual activity: Not on file  Other Topics Concern  . Not on file  Social History Narrative  . Not on file   Social Determinants of Health   Financial Resource Strain:   . Difficulty of Paying Living Expenses: Not on file  Food Insecurity:   . Worried About Charity fundraiser in the Last Year: Not on file  . Ran Out of Food in the Last Year: Not on file  Transportation Needs:   . Lack of Transportation (Medical): Not on file  . Lack of Transportation (Non-Medical): Not on file  Physical Activity:   . Days of Exercise per Week: Not on file  . Minutes of Exercise per Session: Not on  file  Stress:   . Feeling of Stress : Not on file  Social Connections:   . Frequency of Communication with Friends and Family: Not on file  . Frequency of Social Gatherings with Friends and Family: Not on file  . Attends Religious Services: Not on file  . Active Member of Clubs or Organizations: Not on file  . Attends Archivist Meetings: Not on file  . Marital Status: Not on file     Family History: The patient's family history includes Asthma in his father; Heart failure in his mother.  ROS:   Please see the history of present illness.    *** All other systems reviewed and are negative.  EKGs/Labs/Other Studies Reviewed:    The following studies were reviewed today: ***  EKG:  EKG ***  Recent Labs: No results found for requested labs within last 8760 hours.  Recent Lipid Panel No results found for: CHOL, TRIG, HDL, CHOLHDL, VLDL, LDLCALC, LDLDIRECT  Physical Exam:    VS:  There were no vitals taken for this visit.    Wt Readings from Last 3 Encounters:  07/03/20 198 lb (89.8 kg)  06/21/20 198 lb (89.8 kg)  05/10/20 190 lb (86.2 kg)     GEN: ***. No acute distress HEENT: Normal NECK:  No JVD. LYMPHATICS: No lymphadenopathy CARDIAC: *** RRR without murmur, gallop, or edema. VASCULAR: *** Normal Pulses. No bruits. RESPIRATORY:  Clear to auscultation without rales, wheezing or rhonchi  ABDOMEN: Soft, non-tender, non-distended, No pulsatile mass, MUSCULOSKELETAL: No deformity  SKIN: Warm and dry NEUROLOGIC:  Alert and oriented x 3 PSYCHIATRIC:  Normal affect   ASSESSMENT:    1. Hypertensive heart disease without heart failure   2. Abnormal ECG   3. Educated about COVID-19 virus infection    PLAN:    In order of problems listed above:  1. ***   Medication Adjustments/Labs and Tests Ordered: Current medicines are reviewed at length with the patient today.  Concerns regarding medicines are outlined above.  No orders of the defined types were  placed in this encounter.  No orders of the defined types were placed in this encounter.   There are no Patient Instructions on file for this visit.   Signed, Sinclair Grooms, MD  07/27/2020 1:05 PM    [facility]

## 2020-07-31 ENCOUNTER — Ambulatory Visit: Payer: BC Managed Care – PPO | Admitting: Interventional Cardiology

## 2020-08-01 ENCOUNTER — Ambulatory Visit (AMBULATORY_SURGERY_CENTER): Payer: Self-pay

## 2020-08-01 ENCOUNTER — Encounter: Payer: Self-pay | Admitting: Gastroenterology

## 2020-08-01 ENCOUNTER — Other Ambulatory Visit: Payer: Self-pay

## 2020-08-01 VITALS — Ht 68.0 in | Wt 196.4 lb

## 2020-08-01 DIAGNOSIS — Z01818 Encounter for other preprocedural examination: Secondary | ICD-10-CM

## 2020-08-01 DIAGNOSIS — Z1211 Encounter for screening for malignant neoplasm of colon: Secondary | ICD-10-CM

## 2020-08-01 MED ORDER — SUTAB 1479-225-188 MG PO TABS: 12.0000 | ORAL_TABLET | ORAL | 0 refills | Status: DC

## 2020-08-01 NOTE — Progress Notes (Signed)
No allergies to soy or egg Pt is not on blood thinners or diet pills Denies issues with sedation/intubation Denies atrial flutter/fib Denies constipation   Emmi instructions given to pt  Pt is aware of Covid safety and care partner requirements.  

## 2020-08-11 ENCOUNTER — Other Ambulatory Visit: Payer: Self-pay | Admitting: Gastroenterology

## 2020-08-11 LAB — SARS CORONAVIRUS 2 (TAT 6-24 HRS): SARS Coronavirus 2: NEGATIVE

## 2020-08-15 ENCOUNTER — Other Ambulatory Visit: Payer: Self-pay

## 2020-08-15 ENCOUNTER — Encounter: Payer: Self-pay | Admitting: Gastroenterology

## 2020-08-15 ENCOUNTER — Ambulatory Visit (AMBULATORY_SURGERY_CENTER): Payer: BC Managed Care – PPO | Admitting: Gastroenterology

## 2020-08-15 VITALS — BP 140/86 | HR 66 | Temp 97.8°F | Resp 18 | Ht 68.0 in | Wt 196.4 lb

## 2020-08-15 DIAGNOSIS — K635 Polyp of colon: Secondary | ICD-10-CM | POA: Diagnosis not present

## 2020-08-15 DIAGNOSIS — Z1211 Encounter for screening for malignant neoplasm of colon: Secondary | ICD-10-CM | POA: Diagnosis present

## 2020-08-15 DIAGNOSIS — D123 Benign neoplasm of transverse colon: Secondary | ICD-10-CM | POA: Diagnosis not present

## 2020-08-15 DIAGNOSIS — D125 Benign neoplasm of sigmoid colon: Secondary | ICD-10-CM

## 2020-08-15 MED ORDER — HYDROCORTISONE (PERIANAL) 2.5 % EX CREA: 1.0000 "application " | TOPICAL_CREAM | Freq: Two times a day (BID) | CUTANEOUS | 1 refills | Status: AC

## 2020-08-15 MED ORDER — SODIUM CHLORIDE 0.9 % IV SOLN: 500.0000 mL | Freq: Once | INTRAVENOUS | Status: DC

## 2020-08-15 NOTE — Progress Notes (Signed)
Pt's states no medical or surgical changes since previsit or office visit. 

## 2020-08-15 NOTE — Patient Instructions (Signed)
Handouts given:  Polyps, Hemorrhoids Resume previous diet Continue current medications Await pathology results Repeat colonoscopy in 3-5 years  YOU HAD AN ENDOSCOPIC PROCEDURE TODAY AT [facility]:   Refer to the procedure report that was given to you for any specific questions about what was found during the examination.  If the procedure report does not answer your questions, please call your gastroenterologist to clarify.  If you requested that your care partner not be given the details of your procedure findings, then the procedure report has been included in a sealed envelope for you to review at your convenience later.  YOU SHOULD EXPECT: Some feelings of bloating in the abdomen. Passage of more gas than usual.  Walking can help get rid of the air that was put into your GI tract during the procedure and reduce the bloating. If you had a lower endoscopy (such as a colonoscopy or flexible sigmoidoscopy) you may notice spotting of blood in your stool or on the toilet paper. If you underwent a bowel prep for your procedure, you may not have a normal bowel movement for a few days.  Please Note:  You might notice some irritation and congestion in your nose or some drainage.  This is from the oxygen used during your procedure.  There is no need for concern and it should clear up in a day or so.  SYMPTOMS TO REPORT IMMEDIATELY:   Following lower endoscopy (colonoscopy or flexible sigmoidoscopy):  Excessive amounts of blood in the stool  Significant tenderness or worsening of abdominal pains  Swelling of the abdomen that is new, acute  Fever of 100F or higher  For urgent or emergent issues, a gastroenterologist can be reached at any hour by calling [phone]. Do not use MyChart messaging for urgent concerns.    DIET:  We do recommend a small meal at first, but then you may proceed to your regular diet.  Drink plenty of fluids but you should avoid alcoholic beverages for  24 hours.  ACTIVITY:  You should plan to take it easy for the rest of today and you should NOT DRIVE or use heavy machinery until tomorrow (because of the sedation medicines used during the test).    FOLLOW UP: Our staff will call the number listed on your records 48-72 hours following your procedure to check on you and address any questions or concerns that you may have regarding the information given to you following your procedure. If we do not reach you, we will leave a message.  We will attempt to reach you two times.  During this call, we will ask if you have developed any symptoms of COVID 19. If you develop any symptoms (ie: fever, flu-like symptoms, shortness of breath, cough etc.) before then, please call [phone].  If you test positive for Covid 19 in the 2 weeks post procedure, please call and report this information to [place].    If any biopsies were taken you will be contacted by phone or by letter within the next 1-3 weeks.  Please call us at [phone] if you have not heard about the biopsies in 3 weeks.    SIGNATURES/CONFIDENTIALITY: You and/or your care partner have signed paperwork which will be entered into your electronic medical record.  These signatures attest to the fact that that the information above on your After Visit Summary has been reviewed and is understood.  Full responsibility of the confidentiality of this discharge information lies with you and/or your  care-partner.

## 2020-08-15 NOTE — Progress Notes (Signed)
Called to room to assist during endoscopic procedure.  Patient ID and intended procedure confirmed with present staff. Received instructions for my participation in the procedure from the performing physician.  

## 2020-08-15 NOTE — Progress Notes (Signed)
pt tolerated well. VSS. awake and to recovery. Report given to RN.  

## 2020-08-15 NOTE — Op Note (Signed)
[facility] Patient Name: Adam Martinez Procedure Date: 08/15/2020 3:04 PM MRN: [id number] Endoscopist: Mauri Pole , MD Age: 64 Referring MD:  Date of Birth: Feb 26, 1956 Gender: Male Account #: [id number] Procedure:                Colonoscopy Indications:              Screening for colorectal malignant neoplasm Medicines:                Monitored Anesthesia Care Procedure:                Pre-Anesthesia Assessment:                           - Prior to the procedure, a History and Physical                            was performed, and patient medications and                            allergies were reviewed. The patient's tolerance of                            previous anesthesia was also reviewed. The risks                            and benefits of the procedure and the sedation                            options and risks were discussed with the patient.                            All questions were answered, and informed consent                            was obtained. Prior Anticoagulants: The patient has                            taken no previous anticoagulant or antiplatelet                            agents. ASA Grade Assessment: II - A patient with                            mild systemic disease. After reviewing the risks                            and benefits, the patient was deemed in                            satisfactory condition to undergo the procedure.                           After obtaining informed consent, the colonoscope  was passed under direct vision. Throughout the                            procedure, the patient's blood pressure, pulse, and                            oxygen saturations were monitored continuously. The                            Colonoscope was introduced through the anus and                            advanced to the the cecum, identified by                            appendiceal orifice and  ileocecal valve. The                            colonoscopy was performed without difficulty. The                            patient tolerated the procedure well. The quality                            of the bowel preparation was adequate. The                            ileocecal valve, appendiceal orifice, and rectum                            were photographed. Scope In: 3:12:36 PM Scope Out: 3:28:58 PM Scope Withdrawal Time: 0 hours 13 minutes 29 seconds  Total Procedure Duration: 0 hours 16 minutes 22 seconds  Findings:                 The perianal and digital rectal examinations were                            normal.                           Three sessile polyps were found in the transverse                            colon. The polyps were 3 to 4 mm in size. These                            polyps were removed with a cold snare. Resection                            and retrieval were complete.                           A 2 mm polyp was found in the sigmoid colon. The  polyp was sessile. The polyp was removed with a                            cold biopsy forceps. Resection and retrieval were                            complete.                           Non-bleeding internal hemorrhoids were found during                            retroflexion. The hemorrhoids were small. Complications:            No immediate complications. Estimated Blood Loss:     Estimated blood loss was minimal. Impression:               - Three 3 to 4 mm polyps in the transverse colon,                            removed with a cold snare. Resected and retrieved.                           - One 2 mm polyp in the sigmoid colon, removed with                            a cold biopsy forceps. Resected and retrieved.                           - Non-bleeding internal hemorrhoids. Recommendation:           - Patient has a contact number available for                            emergencies.  The signs and symptoms of potential                            delayed complications were discussed with the                            patient. Return to normal activities tomorrow.                            Written discharge instructions were provided to the                            patient.                           - Resume previous diet.                           - Continue present medications.                           - Await pathology results.                           -  Repeat colonoscopy in 3 - 5 years for                            surveillance based on pathology results. Mauri Pole, MD 08/15/2020 3:36:38 PM This report has been signed electronically.

## 2020-08-17 ENCOUNTER — Telehealth: Payer: Self-pay | Admitting: *Deleted

## 2020-08-17 NOTE — Telephone Encounter (Signed)
Left message on f/u call 

## 2020-08-17 NOTE — Telephone Encounter (Signed)
No answer for post procedure call back. Left message for patient to call with questions or concerns. 

## 2020-09-04 ENCOUNTER — Encounter: Payer: Self-pay | Admitting: Gastroenterology

## 2020-12-11 ENCOUNTER — Encounter: Payer: Self-pay | Admitting: Gastroenterology

## 2021-08-01 ENCOUNTER — Ambulatory Visit (INDEPENDENT_AMBULATORY_CARE_PROVIDER_SITE_OTHER): Payer: BC Managed Care – PPO | Admitting: Family Medicine

## 2021-08-01 VITALS — Ht 69.0 in | Wt 190.0 lb

## 2021-08-01 DIAGNOSIS — M25511 Pain in right shoulder: Secondary | ICD-10-CM | POA: Diagnosis not present

## 2021-08-01 MED ORDER — NAPROXEN 500 MG PO TABS: 500.0000 mg | ORAL_TABLET | Freq: Two times a day (BID) | ORAL | 2 refills | Status: DC

## 2021-08-01 NOTE — Progress Notes (Signed)
PCP: Wenda Low, MD  Subjective:   HPI: Patient is a 65 y.o. male here for right shoulder pain.  Patient reports about 3 months ago he jumped up to grab a branch of a tree and felt a pull in his right shoulder. Since that time has had soreness in right shoulder. Did very well after injection last visit a year ago up until this injury. Some grinding within right shoulder. Using biofreeze. No night pain.  Past Medical History:  Diagnosis Date   Cataract    rt eye - not ready for surgery   Hypertension     Current Outpatient Medications on File Prior to Visit  Medication Sig Dispense Refill   amLODipine (NORVASC) 5 MG tablet Take 1 tablet (5 mg total) by mouth daily. 90 tablet 1   MAGNESIUM PO Take by mouth.     prednisoLONE acetate (PRED FORTE) 1 % ophthalmic suspension 1 drop 4 (four) times daily.     valsartan-hydrochlorothiazide (DIOVAN-HCT) 160-12.5 MG tablet Take 1 tablet by mouth daily.     No current facility-administered medications on file prior to visit.    Past Surgical History:  Procedure Laterality Date   TOE SURGERY      Allergies  Allergen Reactions   Chloroquine Itching    Ht 5\' 9"  (1.753 m)   Wt 190 lb (86.2 kg)   BMI 28.06 kg/m   [facility] Adult Exercise 08/01/2021  Frequency of aerobic exercise (# of days/week) 3  Average time in minutes 20  Frequency of strengthening activities (# of days/week) 3    No flowsheet data found.      Objective:  Physical Exam:  Gen: NAD, comfortable in exam room  Right shoulder: No swelling, ecchymoses.  No gross deformity. No TTP. FROM. Negative Hawkins, Neers. Negative Yergasons. Strength 5/5 with empty can and resisted internal/external rotation. Negative apprehension. NV intact distally.   Assessment & Plan:  1. Right shoulder pain - 2/2 flare of known mild arthropathy.  Start naproxen, home exercise program.  Consider repeat glenohumeral injection if not improving.

## 2021-08-01 NOTE — Patient Instructions (Signed)
You flared up the mild arthritis you have in your shoulder. Take naproxen 500mg  twice a day with food for pain and inflammation - take regularly for at least 1 week then as needed. Do home exercises daily for the next 6 weeks. If not improving over the next 6 weeks come back and see me and we will do a repeat injection into your shoulder joint. If I don't see you, safe travels!

## 2021-10-05 ENCOUNTER — Other Ambulatory Visit: Payer: Self-pay

## 2021-10-05 ENCOUNTER — Encounter: Payer: Self-pay | Admitting: Cardiology

## 2021-10-05 ENCOUNTER — Ambulatory Visit: Payer: BC Managed Care – PPO | Admitting: Cardiology

## 2021-10-05 VITALS — BP 159/84 | HR 79 | Temp 98.0°F | Resp 16 | Ht 69.0 in | Wt 199.0 lb

## 2021-10-05 DIAGNOSIS — R079 Chest pain, unspecified: Secondary | ICD-10-CM

## 2021-10-05 DIAGNOSIS — I10 Essential (primary) hypertension: Secondary | ICD-10-CM

## 2021-10-05 MED ORDER — AMLODIPINE BESYLATE 10 MG PO TABS: 5.0000 mg | ORAL_TABLET | Freq: Every day | ORAL | 1 refills | Status: AC

## 2021-10-05 NOTE — Progress Notes (Signed)
Patient referred by Wenda Low, MD for exertional chest pain  Subjective:   Adam Martinez, male    DOB: July 16, 1956, 65 y.o.   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 Last few weeks, he was noted to have uncontrolled hypertension. He occasionally has had retrosternal sharp chest pain lasting for few seconds, while climbing stairs. He has not really had pain while playing soccer, last done in September 2022.   He was previously on valsartan-HCTZ 160-12.5 mg, recently increased to 320-25 mg. Amlodipine 5 mg was also added. Blood pressure improved, but remains uncontrolled.   Past Medical History:  Diagnosis Date   Cataract    rt eye - not ready for surgery   Hypertension      Past Surgical History:  Procedure Laterality Date   TOE SURGERY       Social History   Tobacco Use  Smoking Status Never  Smokeless Tobacco Never    Social History   Substance and Sexual Activity  Alcohol Use Yes   Comment: occasional     Family History  Problem Relation Age of Onset   Heart failure Mother    Asthma Father    Colon cancer Neg Hx    Colon polyps Neg Hx    Esophageal cancer Neg Hx    Rectal cancer Neg Hx    Stomach cancer Neg Hx      Current Outpatient Medications on File Prior to Visit  Medication Sig Dispense Refill   amLODipine (NORVASC) 5 MG tablet Take 1 tablet (5 mg total) by mouth daily. 90 tablet 1   MAGNESIUM PO Take by mouth.     naproxen (NAPROSYN) 500 MG tablet Take 1 tablet (500 mg total) by mouth in the morning and at bedtime. 60 tablet 2   prednisoLONE acetate (PRED FORTE) 1 % ophthalmic suspension 1 drop 4 (four) times daily.      valsartan-hydrochlorothiazide (DIOVAN-HCT) 160-12.5 MG tablet Take 1 tablet by mouth daily.     No current facility-administered medications on file prior to visit.    Cardiovascular and other pertinent studies:  EKG 10/05/2021: Sinus rhythm 81 bpm Left atrial enlargement  Left ventricular hypertrophy Inferolateral ST-T changes likely due LVH  ETT 2017: Baseline ECG LVH with strain Non diagnostic for ischemia evaluation Only achieved 80% of PMHR on medical RX Resting BP elevated at 187/103 mmHg Peak exercise BP 220/107 mmHg  Echocardiogram 2017: - Left ventricle: The cavity size was normal. Wall thickness was    increased in a pattern of moderate LVH. Systolic function was    normal. The estimated ejection fraction was in the range of 60%    to 65%. Wall motion was normal; there were no regional wall    motion abnormalities. Features are consistent with a pseudonormal    left ventricular filling pattern, with concomitant abnormal    relaxation and increased filling pressure (grade 2 diastolic    dysfunction).    Recent labs: Not available   Review of Systems  Cardiovascular:  Positive for chest pain. Negative for dyspnea on exertion, leg swelling, palpitations and syncope.        Vitals:   10/05/21 0932 10/05/21 3557  BP: (!) 168/97 (!) 159/84  Pulse: 78 79  Resp: 16   Temp: 98 F (36.7 C)   SpO2: 96%      Body mass index is 29.39 kg/m. Filed Weights   10/05/21 0844  Weight: 199 lb (90.3 kg)     Objective:   Physical Exam Vitals and nursing note reviewed.  Constitutional:      General: He is not in acute distress. Neck:     Vascular: No JVD.  Cardiovascular:     Rate and Rhythm: Normal rate and regular rhythm.     Pulses: Normal pulses.     Heart sounds: Normal heart sounds. No murmur heard. Pulmonary:     Effort: Pulmonary effort is normal.     Breath sounds: Normal breath sounds. No wheezing or rales.  Musculoskeletal:     Right lower leg:  No edema.     Left lower leg: No edema.        Assessment & Recommendations:   65 y/o [place] man, originally from [place], with uncontrolled hypertension, now with occasional chest pain  Chest pain: Atypical, but has risk factors with uncontrolled hypertension Recommend exercise nuclear stress test, echocardiogram, lipid panel, calcium score scan I expect abnormal EKG at rest and stress, but looking for exercise capacity and perfusion imaging.  Hypertension: Uncontrolled. EKG shows LVH Check echocardiogram, renal artery duplex. Increase amlodipine to 10 mg daily. Continue Valsartan-HCTZ 320-25 mg daily.  Further recommendations after above testing.  Thank you for referring the patient to [place]. Please feel free to contact with any questions.    Nigel Mormon, MD Pager: [phone] Office: [phone]

## 2021-10-09 LAB — CBC
Hematocrit: 48.1 % (ref 37.5–51.0)
Hemoglobin: 15.9 g/dL (ref 13.0–17.7)
MCH: 28 pg (ref 26.6–33.0)
MCHC: 33.1 g/dL (ref 31.5–35.7)
MCV: 85 fL (ref 79–97)
Platelets: 245 10*3/uL (ref 150–450)
RBC: 5.68 x10E6/uL (ref 4.14–5.80)
RDW: 13.1 % (ref 11.6–15.4)
WBC: 5.1 10*3/uL (ref 3.4–10.8)

## 2021-10-09 LAB — COMPREHENSIVE METABOLIC PANEL
ALT: 10 IU/L (ref 0–44)
AST: 19 IU/L (ref 0–40)
Albumin/Globulin Ratio: 1.4 (ref 1.2–2.2)
Albumin: 4.6 g/dL (ref 3.8–4.8)
Alkaline Phosphatase: 80 IU/L (ref 44–121)
BUN/Creatinine Ratio: 10 (ref 10–24)
BUN: 12 mg/dL (ref 8–27)
Bilirubin Total: 0.4 mg/dL (ref 0.0–1.2)
CO2: 28 mmol/L (ref 20–29)
Calcium: 10.5 mg/dL — ABNORMAL HIGH (ref 8.6–10.2)
Chloride: 99 mmol/L (ref 96–106)
Creatinine, Ser: 1.26 mg/dL (ref 0.76–1.27)
Globulin, Total: 3.2 g/dL (ref 1.5–4.5)
Glucose: 111 mg/dL — ABNORMAL HIGH (ref 70–99)
Potassium: 4.1 mmol/L (ref 3.5–5.2)
Sodium: 141 mmol/L (ref 134–144)
Total Protein: 7.8 g/dL (ref 6.0–8.5)
eGFR: 64 mL/min/{1.73_m2} (ref >59)

## 2021-10-09 LAB — ALDOSTERONE + RENIN ACTIVITY W/ RATIO
ALDOS/RENIN RATIO: 15.7 (ref 0.0–30.0)
ALDOSTERONE: 11.5 ng/dL (ref 0.0–30.0)
Renin: 0.733 ng/mL/hr (ref 0.167–5.380)

## 2021-10-31 ENCOUNTER — Other Ambulatory Visit: Payer: BC Managed Care – PPO

## 2021-11-07 ENCOUNTER — Ambulatory Visit
Admission: RE | Admit: 2021-11-07 | Discharge: 2021-11-07 | Disposition: A | Discharge status: Home / Self Care | Payer: No Typology Code available for payment source | Source: Ambulatory Visit | Attending: Cardiology | Admitting: Cardiology

## 2021-11-07 DIAGNOSIS — R079 Chest pain, unspecified: Secondary | ICD-10-CM

## 2021-11-08 ENCOUNTER — Ambulatory Visit: Payer: BC Managed Care – PPO | Admitting: Cardiology

## 2021-11-28 ENCOUNTER — Ambulatory Visit: Payer: BC Managed Care – PPO

## 2021-11-28 ENCOUNTER — Other Ambulatory Visit: Payer: Self-pay

## 2021-11-28 DIAGNOSIS — R079 Chest pain, unspecified: Secondary | ICD-10-CM

## 2021-11-30 NOTE — Progress Notes (Signed)
Error

## 2021-12-03 ENCOUNTER — Ambulatory Visit: Payer: BC Managed Care – PPO | Admitting: Cardiology

## 2021-12-03 ENCOUNTER — Other Ambulatory Visit: Payer: Self-pay

## 2021-12-03 ENCOUNTER — Encounter: Payer: Self-pay | Admitting: Cardiology

## 2021-12-03 ENCOUNTER — Ambulatory Visit: Payer: Self-pay | Admitting: Cardiology

## 2021-12-03 VITALS — BP 147/89 | HR 76 | Temp 97.9°F | Ht 69.0 in | Wt 188.0 lb

## 2021-12-03 DIAGNOSIS — I119 Hypertensive heart disease without heart failure: Secondary | ICD-10-CM

## 2021-12-03 DIAGNOSIS — I10 Essential (primary) hypertension: Secondary | ICD-10-CM

## 2021-12-03 MED ORDER — ATENOLOL 50 MG PO TABS: 50.0000 mg | ORAL_TABLET | Freq: Every day | ORAL | 3 refills | Status: AC

## 2021-12-03 NOTE — Progress Notes (Signed)
Patient referred by Wenda Low, MD for exertional chest pain  Subjective:   Adam Martinez, male    DOB: [birth year], 66 y.o.   MRN: [id number]  Chief Complaint  Patient presents with   Hypertensive heart disease without heart failure   Follow-up   Results     HPI  66 y/o [place] man, originally from [place], with uncontrolled hypertension, now with occasional chest pain  Reviewed recent test results with the patient, details below.  Blood pressure is improved, but remains suboptimally controlled.  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 Last few weeks, he was noted to have uncontrolled hypertension. He occasionally has had retrosternal sharp chest pain lasting for few seconds, while climbing stairs. He has not really had pain while playing soccer, last done in September 2022.   He was previously on valsartan-HCTZ 160-12.5 mg, recently increased to 320-25 mg. Amlodipine 5 mg was also added. Blood pressure improved, but remains uncontrolled.  Current Outpatient Medications on File Prior to Visit  Medication Sig Dispense Refill   amLODipine (NORVASC) 10 MG tablet Take 0.5 tablets (5 mg total) by mouth daily. 90 tablet 1   prednisoLONE acetate (PRED FORTE) 1 % ophthalmic suspension 1 drop 4 (four) times daily.     valsartan-hydrochlorothiazide (DIOVAN-HCT) 160-12.5 MG tablet Take 1 tablet by mouth daily.     No current facility-administered medications on file prior to visit.    Cardiovascular and other pertinent studies:  Exercise Sestamibi stress test 11/28/2021: Exercise nuclear stress test was performed using Bruce protocol. Patient reached 7 METS, and 85% of age predicted maximum heart rate. Exercise capacity was low. No chest pain reported. Heart rate and hemodynamic response were normal.  Stress EKG showed sinus tachycardia, LV strain pattern  unchanged from resting EKG.  Low risk study.  Echocardiogram 11/28/2021: Left ventricle cavity is normal in size. Severe concentric hypertrophy of the left ventricle. Mildly increased LVOT velocity likely due to LVH.  Normal global wall motion. Normal LV systolic function with EF 57%.  Normal diastolic filling pattern. No significant valvular abnormality. Normal right atrial pressure.   CT cardiac scoring 11/07/2021: 1. Coronary calcium score is 0. 2.  Aortic Atherosclerosis (ICD10-I70.0). 3. 2 mm nodule in the right middle lobe. No follow-up needed if patient is low-risk (and has no known or suspected primary neoplasm). Non-contrast chest CT can be considered in 12 months if patient is high-risk. This recommendation follows the consensus statement: Guidelines for Management of Incidental Pulmonary Nodules Detected on CT Images: From the Fleischner Society 2017; Radiology 2017; 284:228-243.  EKG 10/05/2021: Sinus rhythm 81 bpm Left atrial enlargement  Left ventricular hypertrophy Inferolateral ST-T changes likely due LVH  ETT 2017: Baseline ECG LVH with strain Non diagnostic for ischemia evaluation Only achieved 80% of PMHR on medical RX Resting BP elevated at 187/103 mmHg Peak exercise BP 220/107 mmHg  Echocardiogram 2017: - Left ventricle: The cavity size was normal. Wall thickness was    increased in a pattern of moderate LVH. Systolic function was    normal. The estimated ejection fraction was in the range of 60%    to 65%. Wall motion was normal; there were no regional wall    motion abnormalities. Features are consistent with a pseudonormal    left ventricular filling pattern, with concomitant abnormal    relaxation and increased filling pressure (grade 2 diastolic    dysfunction).  Recent labs: 10/05/2021: Glucose 111, BUN/Cr 12/1.26. EGFR 64. Na/K 141/4.1. Rest of the CMP normal H/H 15/48. MCV 85. Platelets 245 Renin/aldosterone normal   Review of Systems   Cardiovascular:  Positive for chest pain. Negative for dyspnea on exertion, leg swelling, palpitations and syncope.        Vitals:   12/03/21 0941 12/03/21 0944  BP: (!) 150/89 (!) 147/89  Pulse: 80 76  Temp: 97.9 F (36.6 C)   SpO2: 98% 98%      Body mass index is 27.76 kg/m. Filed Weights   12/03/21 0941  Weight: 188 lb (85.3 kg)      Objective:   Physical Exam Constitutional:      General: He is not in acute distress.    Appearance: Normal appearance.        Assessment & Recommendations:   66 y/o [place] man, originally from [place], with uncontrolled hypertension, now with occasional chest pain  Chest pain: No recurrence.  No ischemia on exercise nuclear stress testing 11/2020.  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   Normal renin/aldosterone Renal artery duplex not performed, but okay to hold for now given improvement in blood pressure. Blood pressure improving, but remains suboptimally controlled on amlodipine 10 mg, valsartan-HCTZ 320-25 mg daily Added atenolol 50 mg daily.  F/u in 2 months    Nigel Mormon, MD Pager: [phone] Office: [phone]

## 2022-02-04 ENCOUNTER — Ambulatory Visit: Payer: BC Managed Care – PPO | Admitting: Cardiology

## 2022-12-31 ENCOUNTER — Other Ambulatory Visit: Payer: Self-pay | Admitting: Urology

## 2022-12-31 DIAGNOSIS — R972 Elevated prostate specific antigen [PSA]: Secondary | ICD-10-CM

## 2023-02-15 ENCOUNTER — Other Ambulatory Visit: Payer: No Typology Code available for payment source

## 2023-10-04 IMAGING — CT CT CARDIAC CORONARY ARTERY CALCIUM SCORE
3 series · 14 of 20 positions shown, 16 images · non-contrast
Comparison: None.

CLINICAL DATA: 66-year-old black male with exertional chest pain.

EXAM:
CT CARDIAC CORONARY ARTERY CALCIUM SCORE
TECHNIQUE: Non-contrast imaging through the heart was performed using
prospective ECG gating. Image post processing was performed on an
independent workstation, allowing for quantitative analysis of the
heart and coronary arteries. Note that this exam targets the heart
and the chest was not imaged in its entirety.

[Series 2: calcium scoring 2.00 qr36 bestdiast 75% hrt calciu · axial · 0.37mm/px · z∈[+1584,+1680]mm · 4 of 80 slices shown]
[im 16/80  vessel]
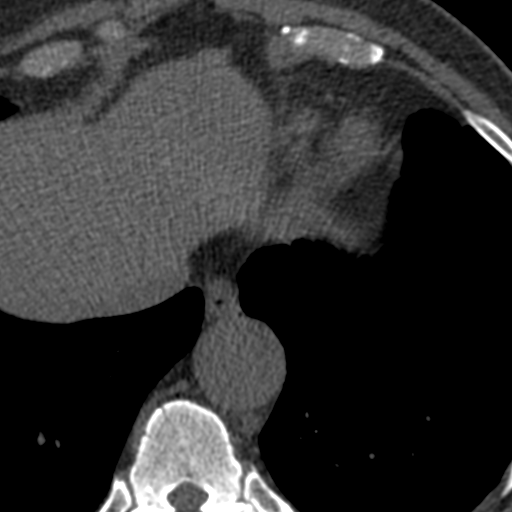
[im 32/80  vessel]
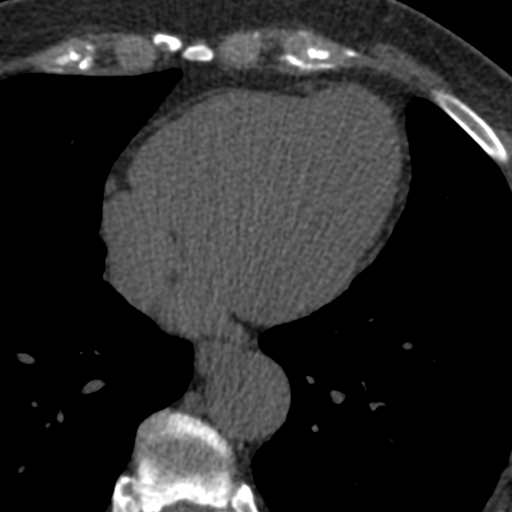
[im 48/80  vessel]
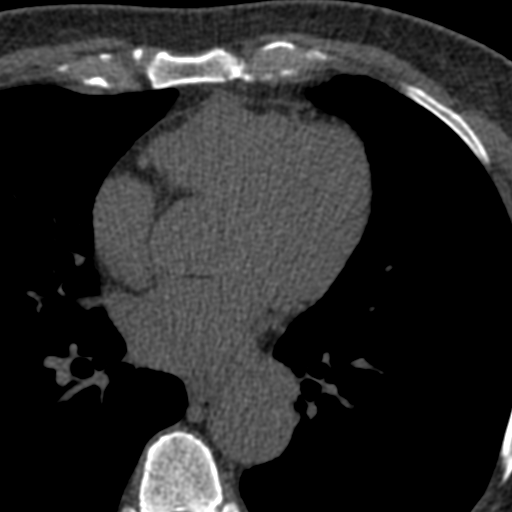
[im 64/80  vessel]
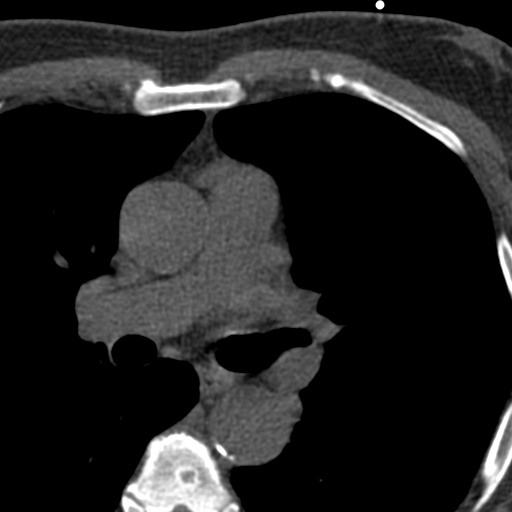

[Series 3: calcium scoring 2.00 br40 bestdiast 75% axial · axial · 0.60mm/px · z∈[+1580,+1684]mm · 5 of 80 slices shown, 7 images]
[im 14/80  vessel]
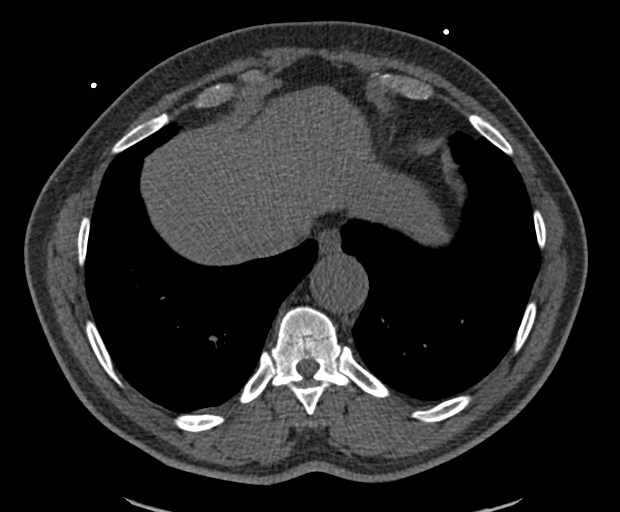
[im 14/80  lung]
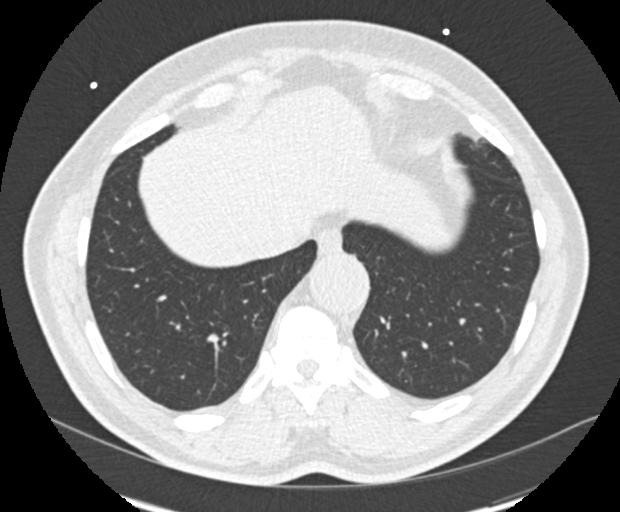
[im 27/80  vessel]
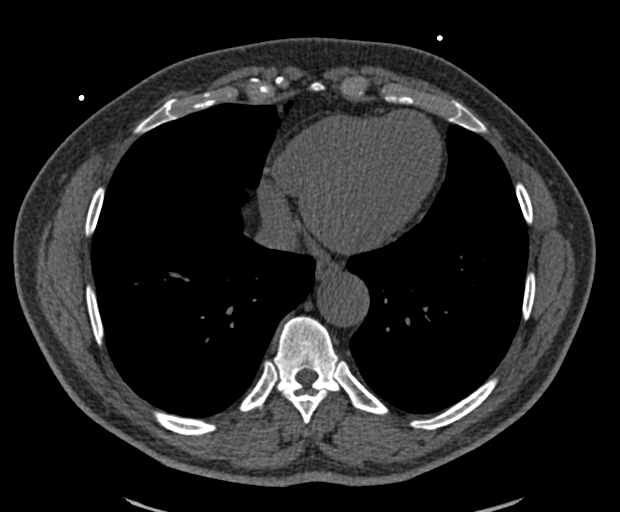
[im 40/80  vessel]
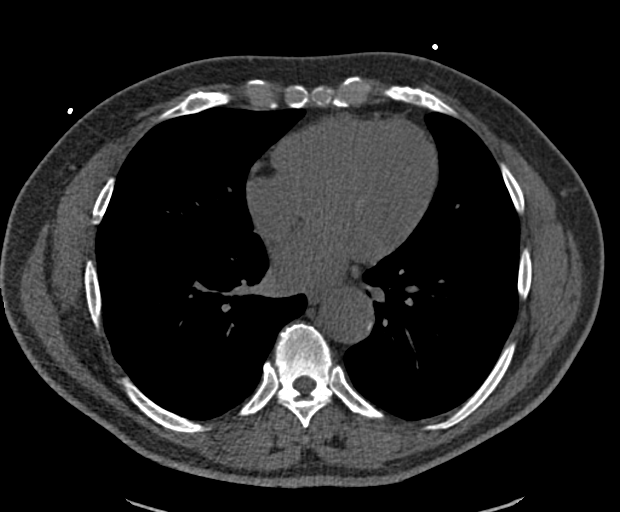
[im 53/80  vessel]
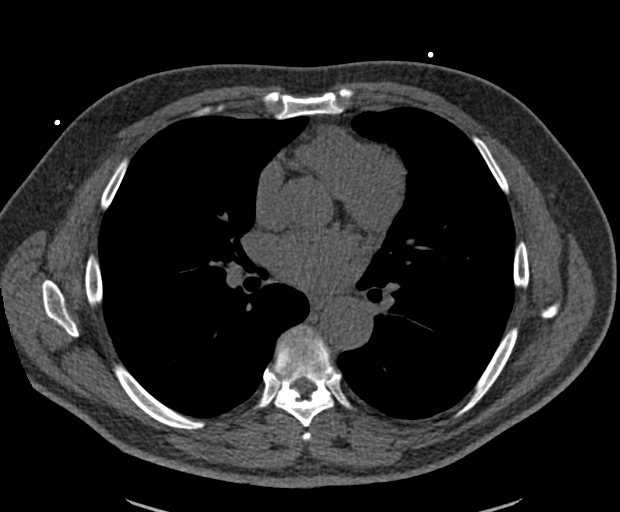
[im 66/80  vessel]
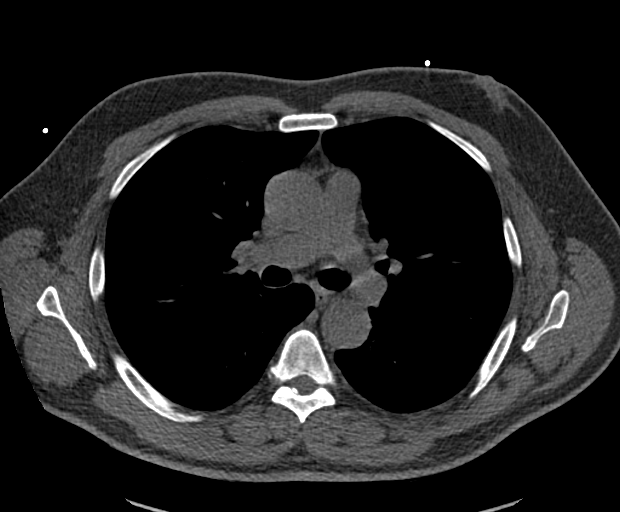
[im 66/80  lung]
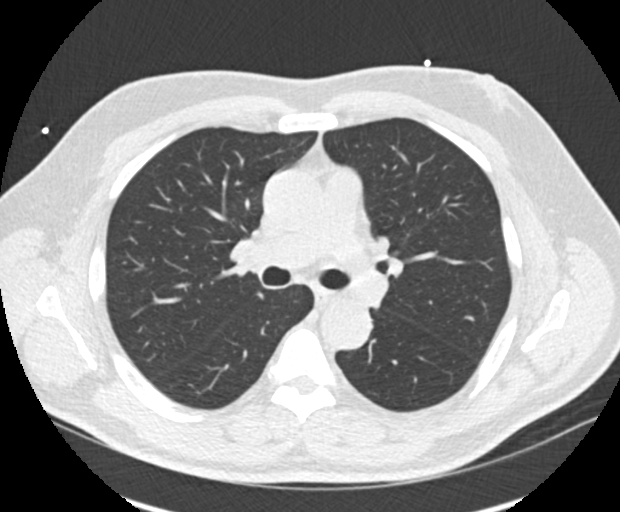

[Series 9: calcium scoring 2.00 br60 bestdiast 75% lungs · axial · 0.60mm/px · z∈[+1580,+1684]mm · 5 of 80 slices shown]
[im 14/80  vessel]
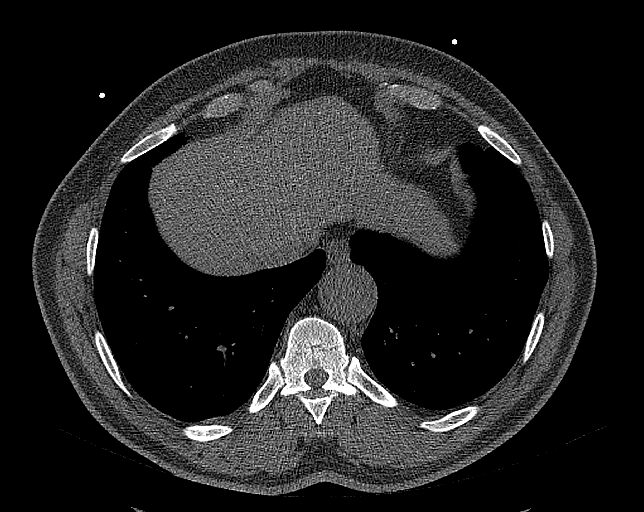
[im 27/80  vessel]
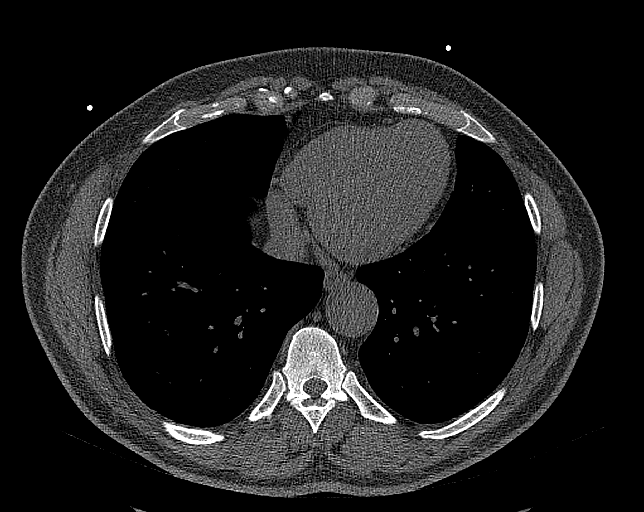
[im 40/80  vessel]
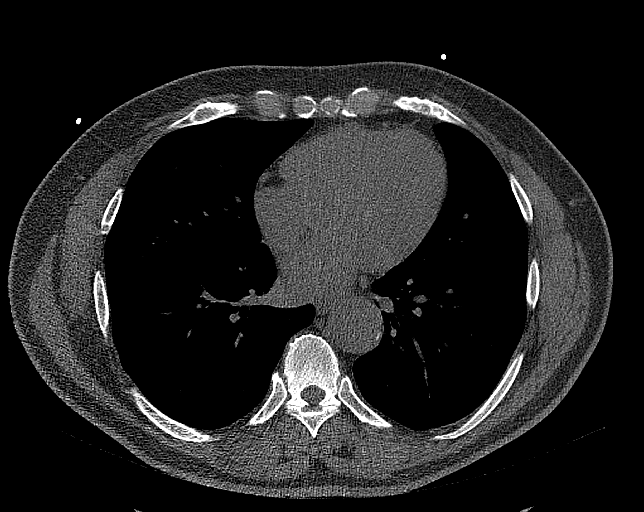
[im 53/80  vessel]
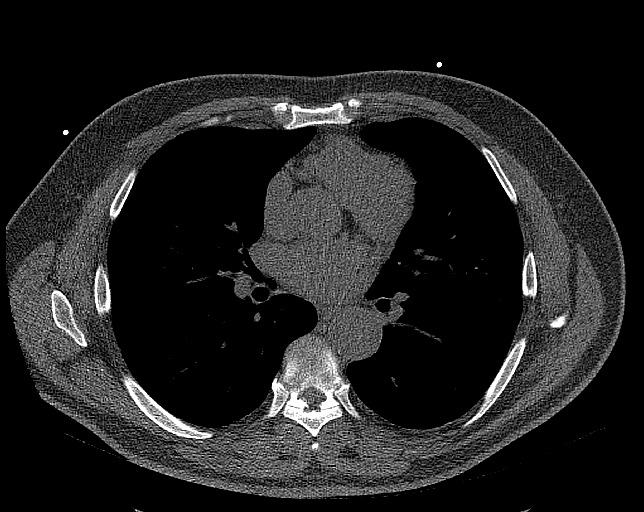
[im 66/80  vessel]
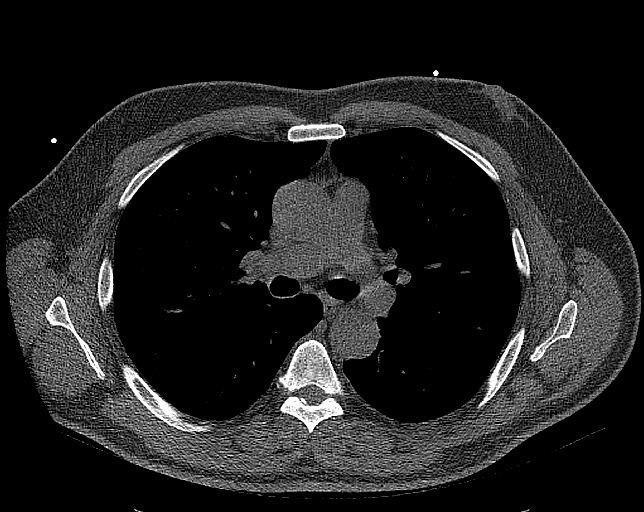

[14 of 20 positions shown; findings below may reference images not displayed]

FINDINGS: CORONARY CALCIUM SCORES:

Left Main: 0

LAD: 0

LCx: 0

RCA: 0

Total Agatston Score: 0

[HOSPITAL] percentile: 0

AORTA MEASUREMENTS:

Ascending Aorta: 34 mm

Descending Aorta: 29 mm

OTHER FINDINGS:

Atherosclerotic calcifications in the descending thoracic aorta.
Heart size is normal. No significant pericardial effusion. Images of
the upper abdomen are unremarkable. Visualized mediastinal
structures are normal. 2 mm nodular density in the periphery of the
right middle lobe on sequence 9 image 32 and question calcification
in this nodule. Otherwise, the visualized lungs are clear. No large
pleural effusions. No acute bone abnormality.
IMPRESSION: 1. Coronary calcium score is 0.
2.  Aortic Atherosclerosis ([id number]).
3. 2 mm nodule in the right middle lobe. No follow-up needed if
patient is low-risk (and has no known or suspected primary
neoplasm). Non-contrast chest CT can be considered in 12 months if
patient is high-risk. This recommendation follows the consensus
statement: Guidelines for Management of Incidental Pulmonary Nodules
Detected on CT Images: From the [HOSPITAL] 2414; Radiology

## 2024-01-23 ENCOUNTER — Encounter: Payer: Self-pay | Admitting: Family Medicine

## 2024-01-23 ENCOUNTER — Other Ambulatory Visit: Payer: Self-pay

## 2024-01-23 ENCOUNTER — Ambulatory Visit: Payer: Self-pay | Admitting: Family Medicine

## 2024-01-23 VITALS — BP 138/88 | Ht 69.0 in | Wt 190.0 lb

## 2024-01-23 DIAGNOSIS — M19011 Primary osteoarthritis, right shoulder: Secondary | ICD-10-CM | POA: Insufficient documentation

## 2024-01-23 DIAGNOSIS — M25511 Pain in right shoulder: Secondary | ICD-10-CM

## 2024-01-23 MED ORDER — METHYLPREDNISOLONE ACETATE 40 MG/ML IJ SUSP
40.0000 mg | Freq: Once | INTRAMUSCULAR | Status: AC
  Administered 2024-01-23: 40 mg via INTRA_ARTICULAR

## 2024-01-23 NOTE — Assessment & Plan Note (Addendum)
 Procedure: Real-time injection of Glenohumeral Joint, Right Shoulder Indications: pain 2/2 osteoarthritis Patient: Adam Martinez 1955-12-24 Procedure date: 01/23/2024 Exacerbation GHJ arthritis CSI

## 2024-01-23 NOTE — Patient Instructions (Signed)
 Today you received an injection with a corticosteroid. This injection is usually done in response to pain and inflammation. There is some "numbing medicine" (Lidocaine) in the shot, so the injected area may be numb and feel really good for the next couple of hours. The numbing medicine usually wears off in 2-3 hours, and then your pain level may be back to where it was before the injection until the steroid starts working.    You may actually experience a small (as in 10%) INCREASE in pressure sensation in the first 24 hours---that is common.  Things to watch out for that you should contact us or a health care provider urgently would include: 1. Unusual (as in more than 10%) increase in pain 2. New fever > 101.5 3. New swelling or redness of the injected area. 4. Streaking of red lines around the area injected.  Do not hesitate to call or reach out with any questions or concerns.

## 2024-01-24 NOTE — Progress Notes (Signed)
  Adam Martinez - 68 y.o. male MRN [id number]  Date of birth: 29-Jun-1956    SUBJECTIVE:      Chief Complaint:/ HPI:   R shoulder pain several weeks to months Had similar before and had GHJ CSI (about 3 years ago) which totally resolved his pain until recently. No known injury or trauma. No weakness or numbness Pain worse with specific movements (forward, cross over and overhead) and shoulder generally feels stiff.     OBJECTIVE: BP 138/88   Ht 5\' 9"  (1.753 m)   Wt 190 lb (86.2 kg)   BMI 28.06 kg/m   Physical Exam:  Vital signs are reviewed. GEN WD WN NAD Shoulder symmetrical  FROM in all planes of rotator cuff bilaterally but pain with forward flexion and empty can testing on right side. Distally NV intact. ROM on right less comfortable han on left with some increased stiffness of movement  PROCEDURE:   Risks, benefits, and alternatives were discussed with the patient.  Verbal and written, informed consent obtained.  Time-out conducted.  Noted no overlying erythema, induration, or other signs of local infection.  Skin prepped in a sterile fashion using ethyl alcohol.  The glenohumeral joint, humeral head, and labrum were visualized in LAX and SAX from the posterior approach demonstrating some hyperechoic changes superior to the labrum.  Local anesthesia: Topical Ethyl chloride  Needle: 22 ga 1/12" Injection: 3/1 mixture of Lidocaine 1% w/out epi and Kenalog 40 mg/mL With sterile technique and under real time ultrasound guidance,  the joint space was injected using the out of plain approach. Doppler was used to capture proper placement.  Bandage was placed.   Post procedural instructions were discussed and printed material given.   Impression: Technically successful ultrasound guided injection.  ASSESSMENT & PLAN:  See problem based charting & AVS for pt instructions. Osteoarthritis of glenohumeral joint, right Procedure: Real-time injection of Glenohumeral Joint, Right  Shoulder Indications: pain 2/2 osteoarthritis Patient: Adam Martinez 1956/05/11 Procedure date: 01/23/2024 Exacerbation GHJ arthritis CSI
